# Patient Record
Sex: Male | Born: 1975 | Race: Black or African American | Hispanic: No | Marital: Married | Smoking: Current every day smoker
Health system: Southern US, Community
[De-identification: ages and names within clinical notes are randomized; demographics above are authoritative.]

## PROBLEM LIST (undated history)

## (undated) DIAGNOSIS — E119 Type 2 diabetes mellitus without complications: Secondary | ICD-10-CM

---

## 2003-05-09 ENCOUNTER — Other Ambulatory Visit: Payer: Self-pay

## 2005-01-25 ENCOUNTER — Inpatient Hospital Stay: Payer: Self-pay | Admitting: Internal Medicine

## 2005-01-25 IMAGING — CR DG ABDOMEN 3V
1 series · 5 of 5 positions shown · non-contrast
Comparison: none

REASON FOR EXAM: Abdominal pain
COMMENTS:

[Series 1: view not recorded · 0.17mm/px · 5 of 5 slices shown]
[im 1/5]
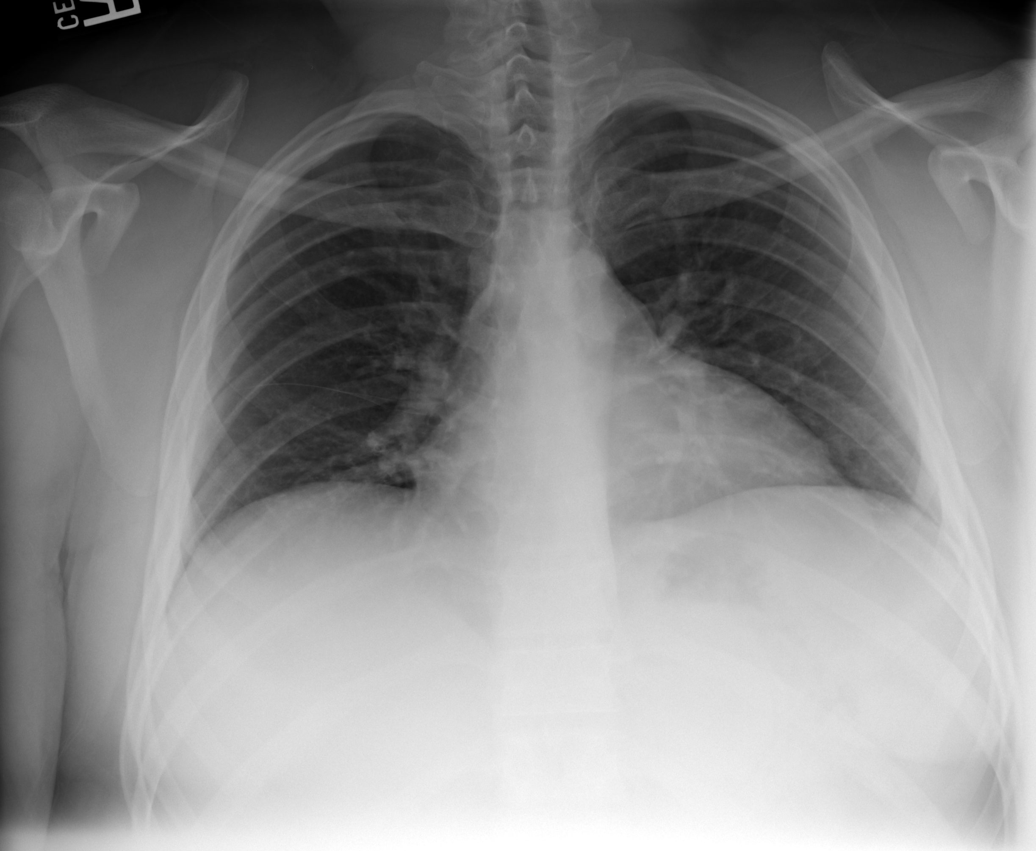
[im 2/5]
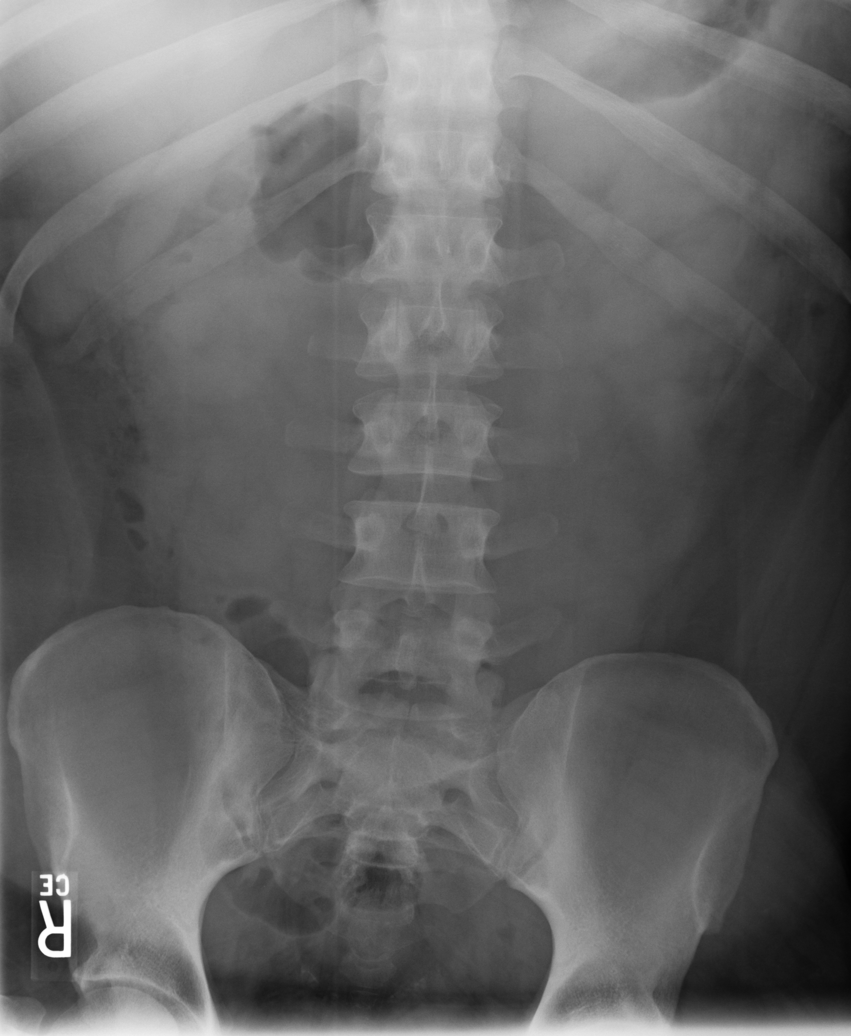
[im 3/5]
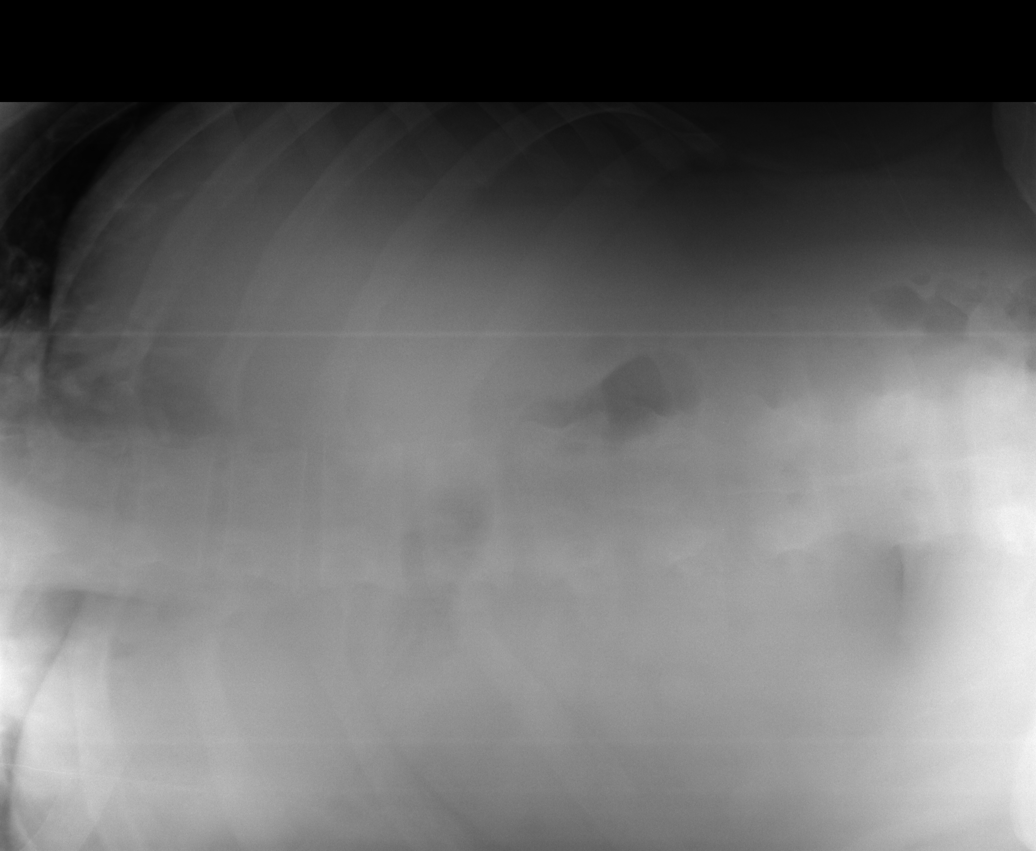
[im 4/5]
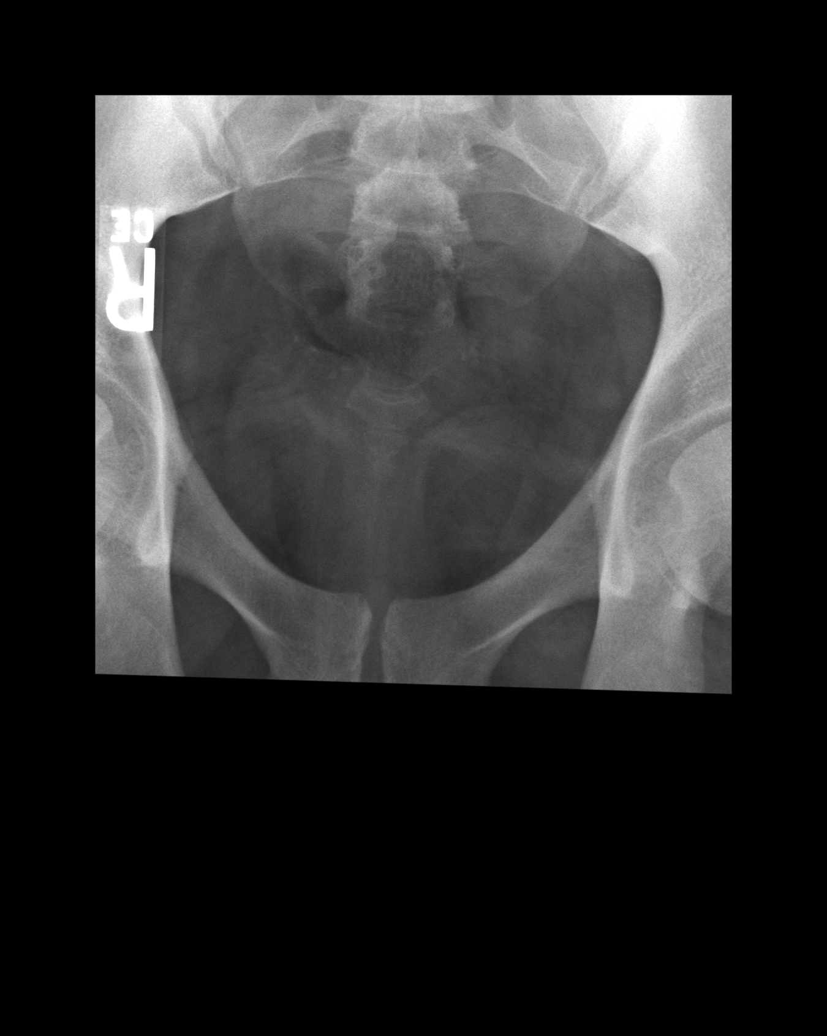
[im 5/5]
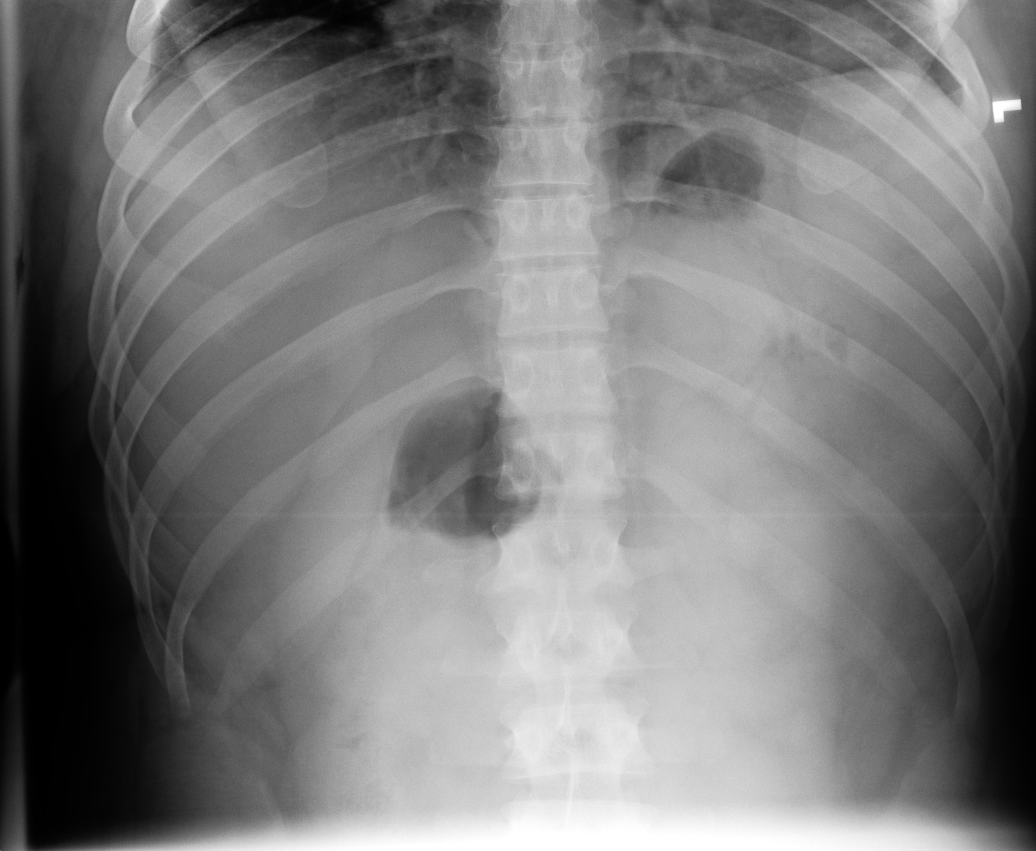

[5 of 5 positions shown; findings below may reference images not displayed]

PROCEDURE:     DXR - DXR ABDOMEN 3-WAY (INCL PA CXR)  - [DATE]  [DATE]

RESULT:     The lungs are hypoinfilated and clear.  The heart is top normal
in size.  The pulmonary vascularity is not engorged.  Three views of the
abdomen reveal a relatively nonspecific bowel gas pattern.  There is no
evidence of obstruction, perforation, or ileus.  I see no abnormal soft
tissue calcifications.
IMPRESSION: I see no acute intraabdominal abnormality on this study.  Specifically there
is no evidence of obstruction or ileus.

The lungs are hypoinflated but I see no evidence of acute cardiopulmonary
disease.

## 2005-06-11 ENCOUNTER — Emergency Department: Payer: Self-pay | Admitting: Emergency Medicine

## 2006-06-18 ENCOUNTER — Emergency Department: Payer: Self-pay | Admitting: Emergency Medicine

## 2007-05-12 ENCOUNTER — Inpatient Hospital Stay: Payer: Self-pay | Admitting: Internal Medicine

## 2008-04-06 ENCOUNTER — Inpatient Hospital Stay: Payer: Self-pay | Admitting: Internal Medicine

## 2008-04-06 IMAGING — CT CT ABD-PELV W/O CM
1 of 2 series · 15 of 32 positions shown, 19 images · non-contrast
Comparison: No comparison

REASON FOR EXAM: (1) epigastric abdominal pain with nausea, vomiting
elevated lipase unable to to
COMMENTS:   LMP: (Male)

PROCEDURE:     CT  - CT ABDOMEN AND PELVIS W[DATE]  [DATE]
RESULT:     Indication: Epigastric pain
TECHNIQUE: Multiple axial images from the lung bases to the symphysis pubis
were obtained without oral or intravenous contrast.

[Series 4: abdomen · axial · 0.88mm/px · z∈[-518,+4]mm · 15 of 197 slices shown, 19 images]
[im 15/197  soft-tissue]
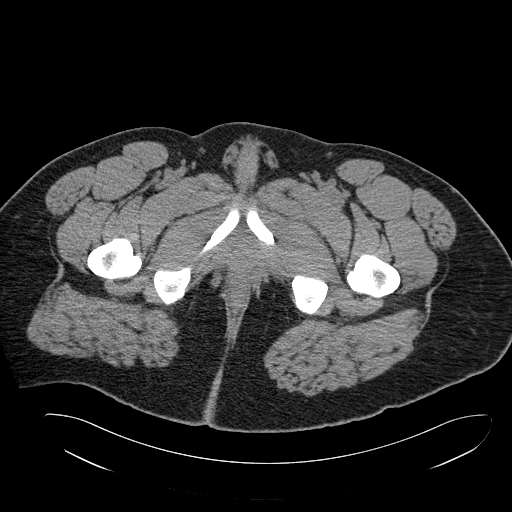
[im 15/197  bone]
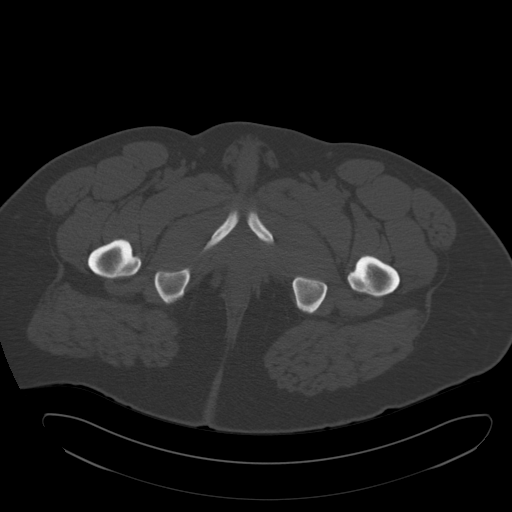
[im 30/197  soft-tissue]
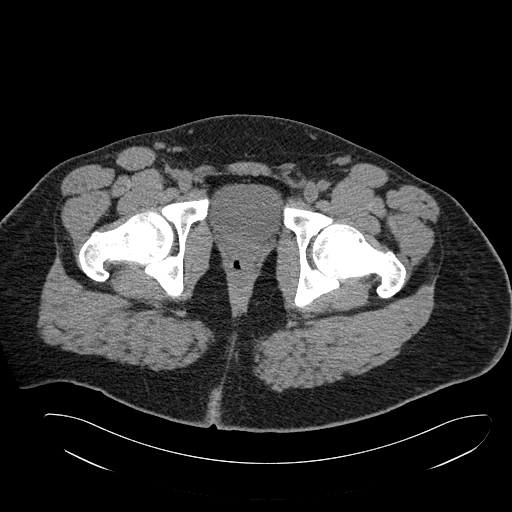
[im 44/197  soft-tissue]
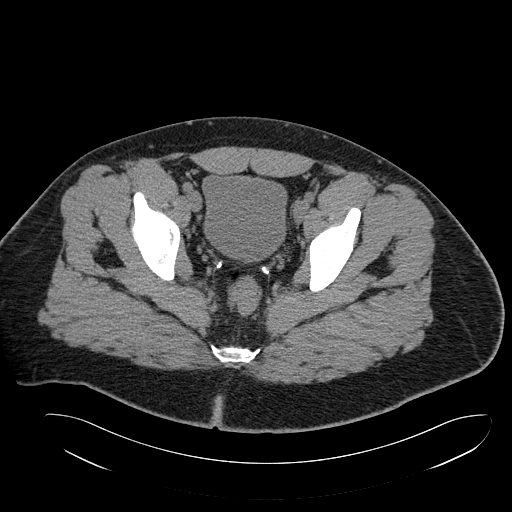
[im 59/197  soft-tissue]
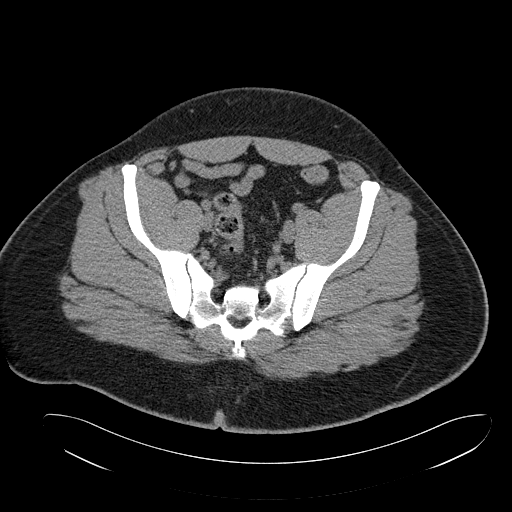
[im 73/197  soft-tissue]
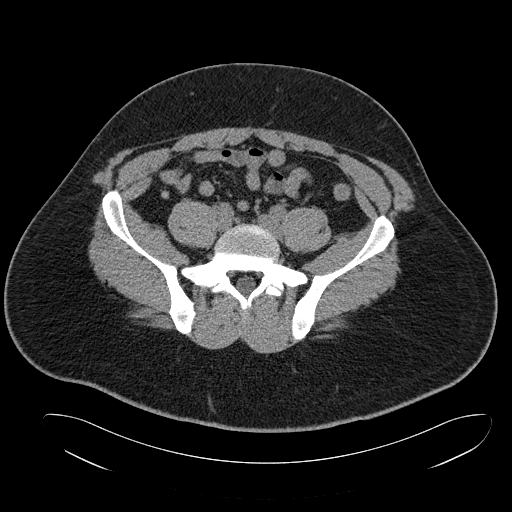
[im 88/197  soft-tissue]
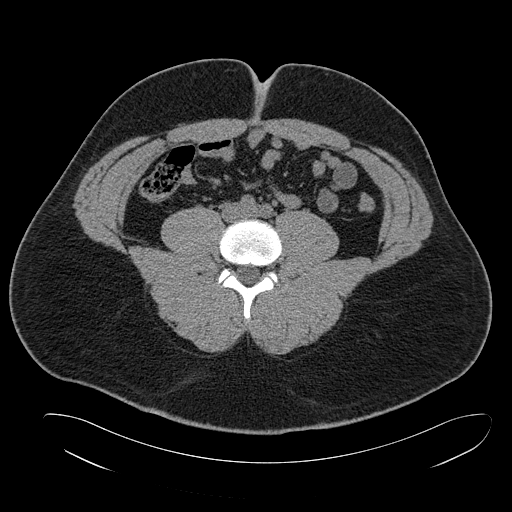
[im 102/197  soft-tissue]
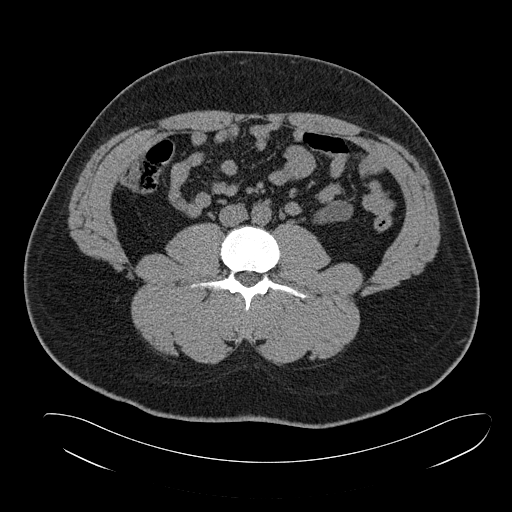
[im 117/197  soft-tissue]
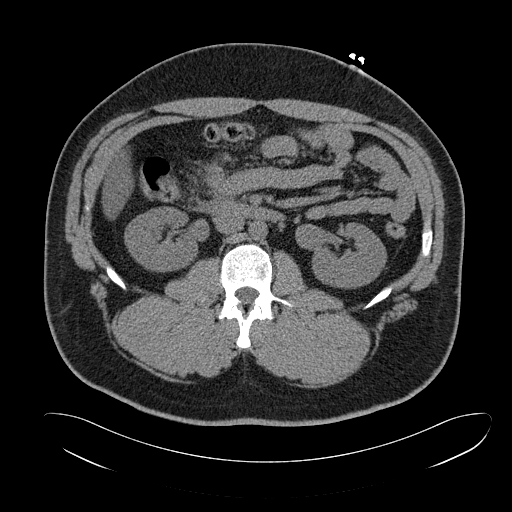
[im 131/197  soft-tissue]
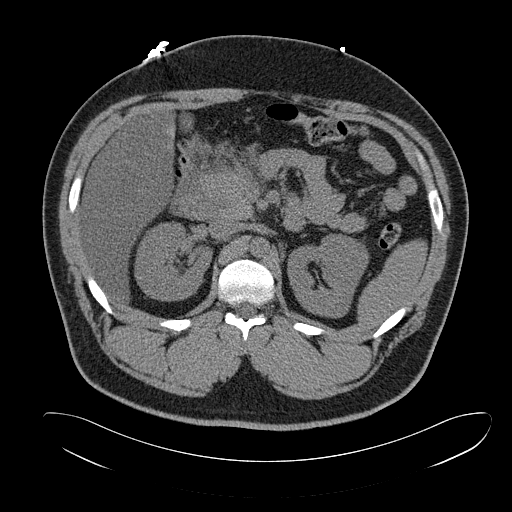
[im 131/197  bone]
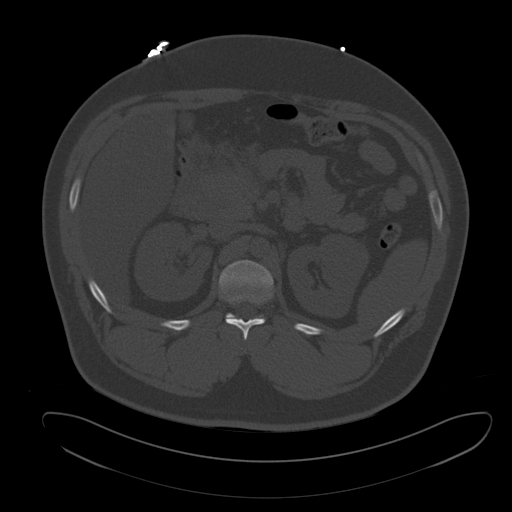
[im 146/197  soft-tissue]
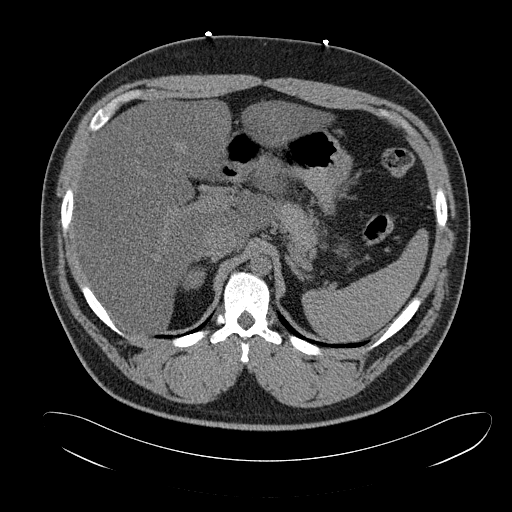
[im 160/197  soft-tissue]
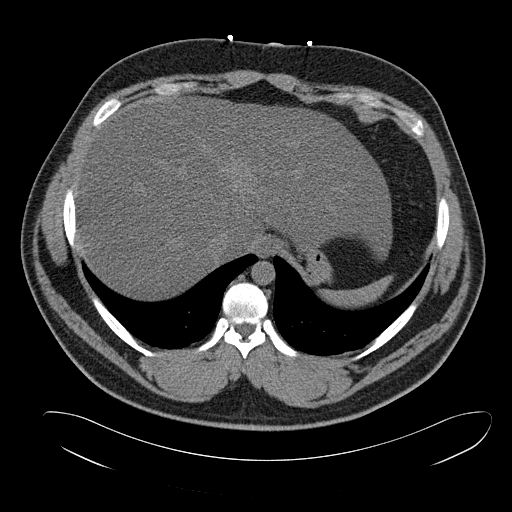
[im 167/197  lung]
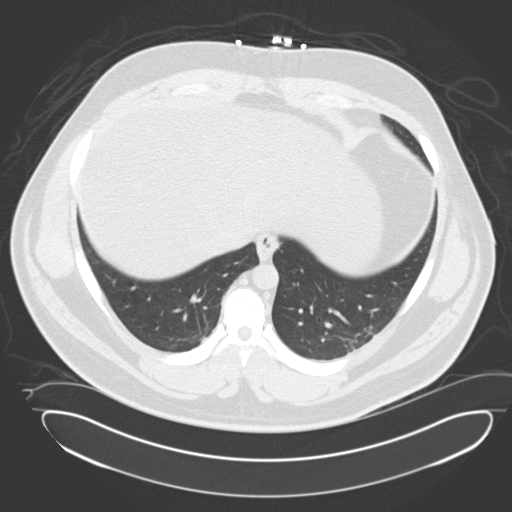
[im 175/197  soft-tissue]
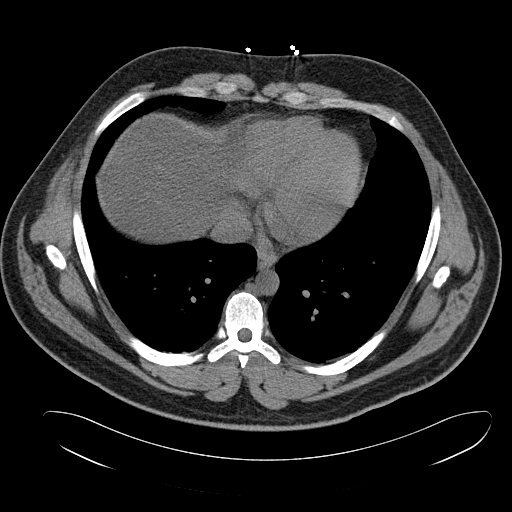
[im 175/197  lung]
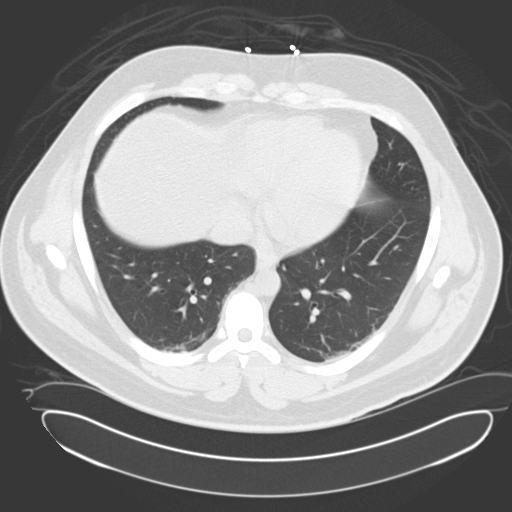
[im 182/197  lung]
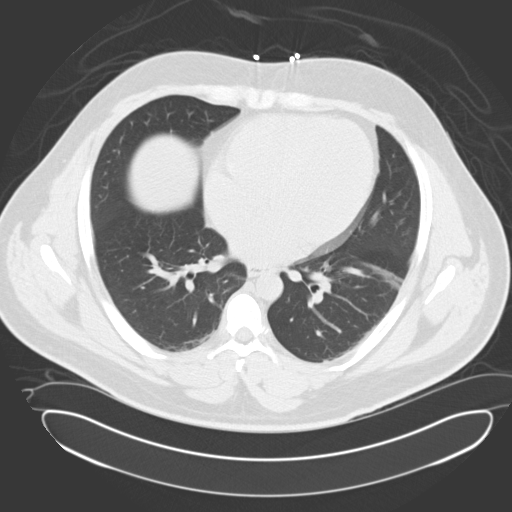
[im 189/197  soft-tissue]
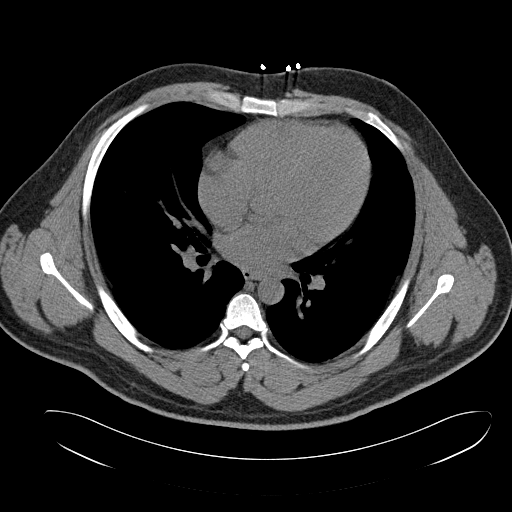
[im 189/197  lung]
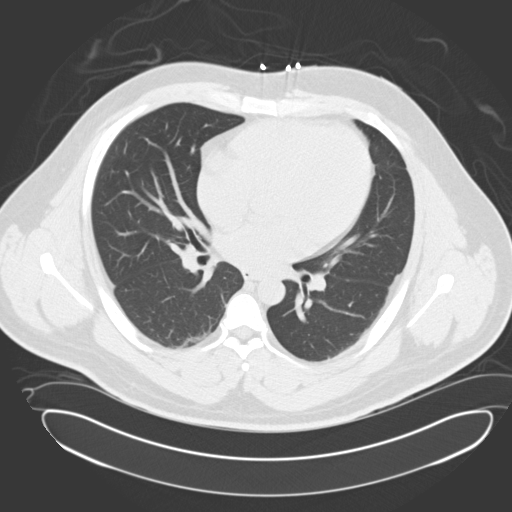

[15 of 32 positions shown; findings below may reference images not displayed]

FINDINGS: The lung bases are clear. There is no pleural or pericardial effusions.

No renal, ureteral, or bladder calculi. No obstructive uropathy.
There are no secondary signs of obstruction. No perinephric stranding is
seen. The kidneys are symmetric in size without evidence for exophytic mass.
The bladder is unremarkable.

The liver is diffusely low in attenuation consistent with hepatic steatosis.
The gallbladder is unremarkable. The spleen demonstrates no focal
abnormality. The adrenal glands are normal. There are peripancreatic
inflammatory changes surrounding the pancreatic head and uncinate process
most consistent with pancreatitis. There is no focal fluid collection to
suggest an abscess.

The unopacified stomach, duodenum, small intestine, and large intestine are
unremarkable, but evaluation is limited by lack of oral contrast. There is
diverticulosis without evidence of diverticulitis. There is a normal caliber
appendix without periappendiceal inflammatory changes. There is no
pneumoperitoneum, pneumatosis, or portal venous gas. There is no abdominal
or pelvic free fluid. There is no lymphadenopathy.

The abdominal aorta is normal in caliber.

The osseous structures are unremarkable.
IMPRESSION: 1. Peripancreatic inflammatory changes around the pancreatic head and
uncinate process most consistent with acute pancreatitis. There is no
evidence of a pseudocyst or drainable fluid collection.

## 2008-04-07 IMAGING — US ABDOMEN ULTRASOUND
1 series · 17 of 25 positions shown · non-contrast
Comparison: none

REASON FOR EXAM: abd. pain
COMMENTS:

[Series 1: abdomen ultrasound · 17 of 50 slices shown]
[im 1/50]
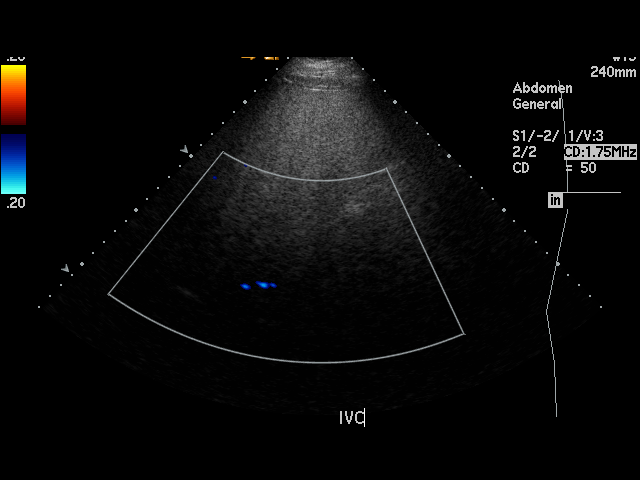
[im 5/50]
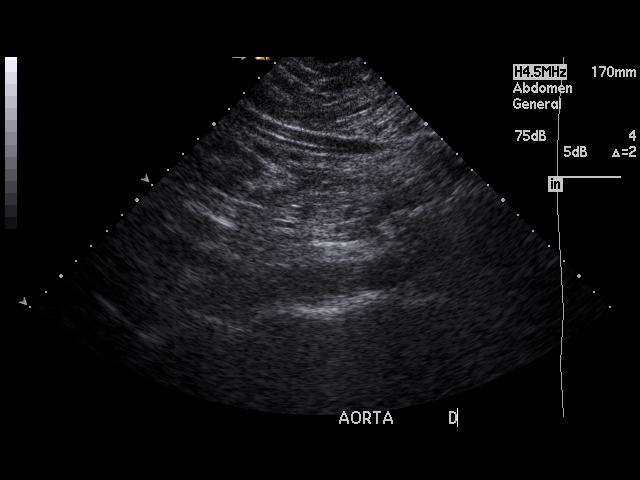
[im 7/50]
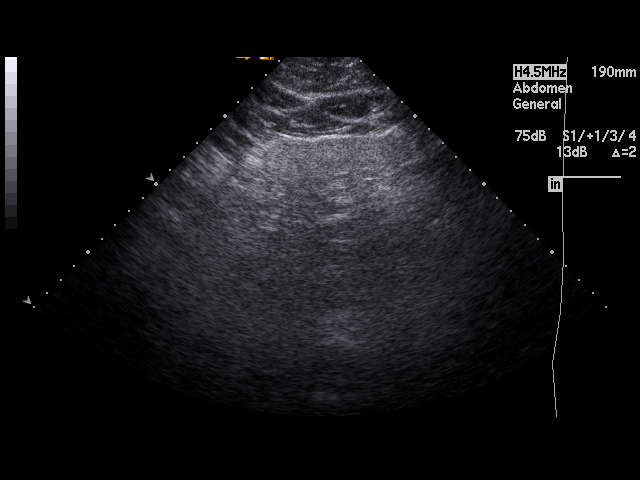
[im 11/50]
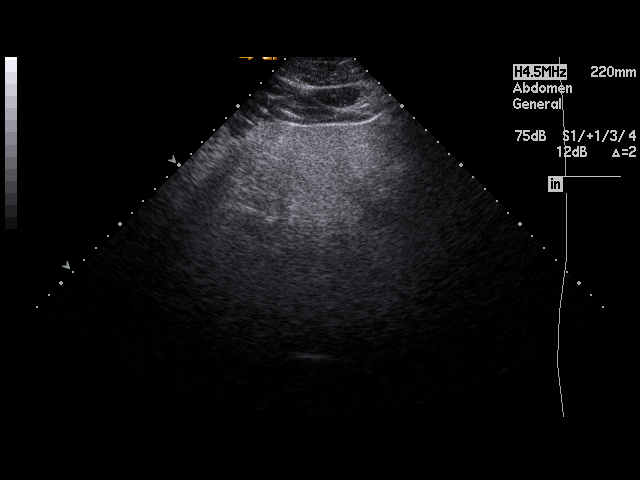
[im 13/50]
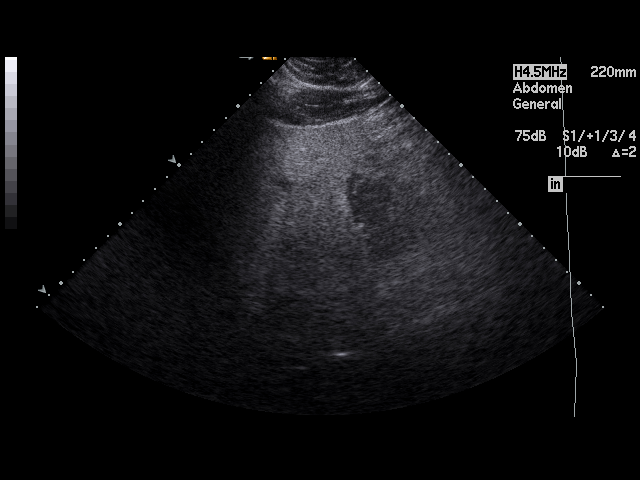
[im 17/50]
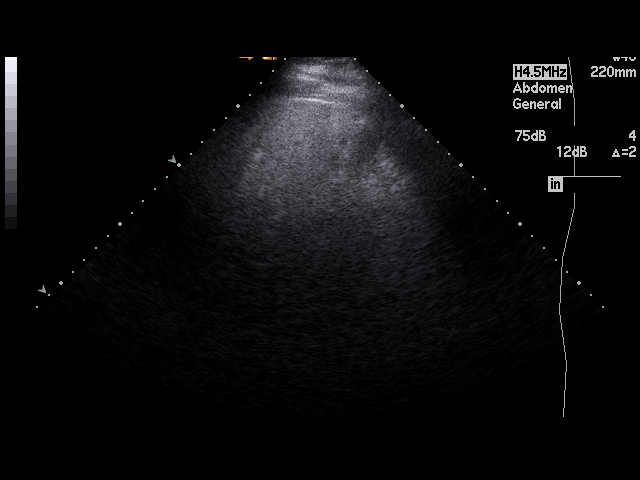
[im 19/50]
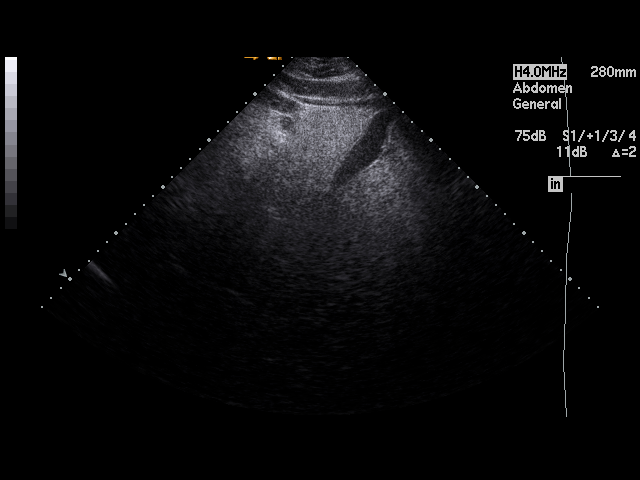
[im 23/50]
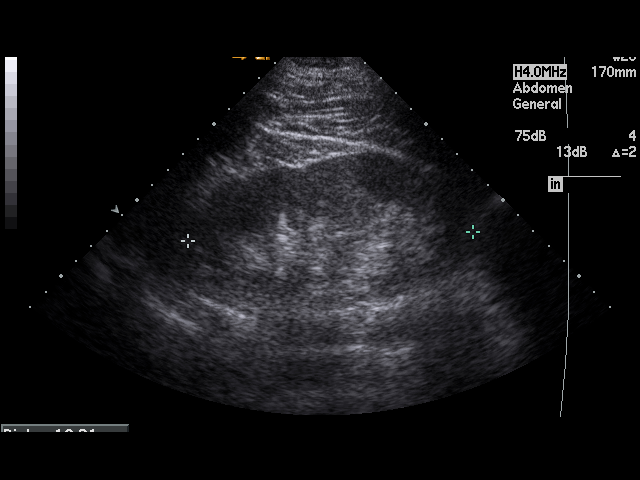
[im 25/50]
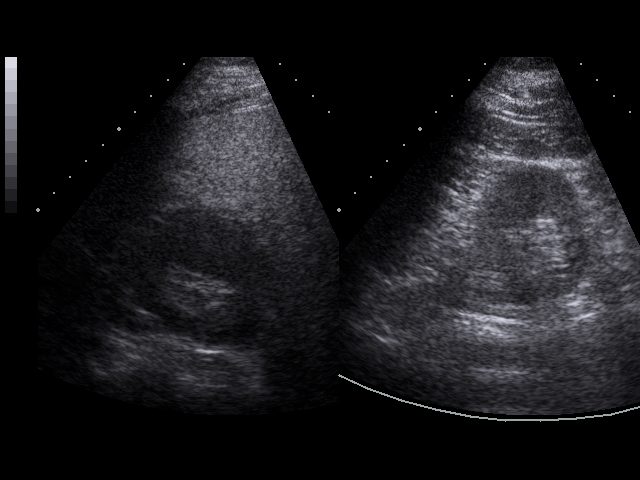
[im 27/50]
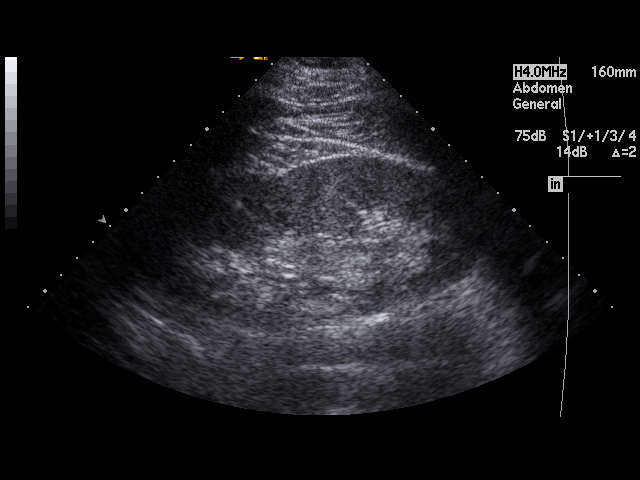
[im 31/50]
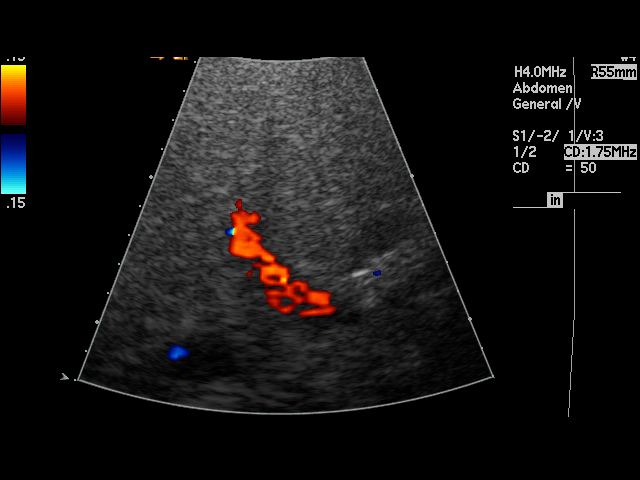
[im 33/50]
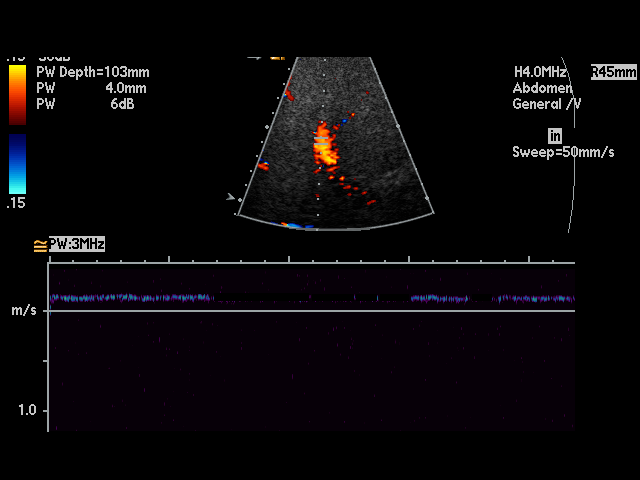
[im 37/50]
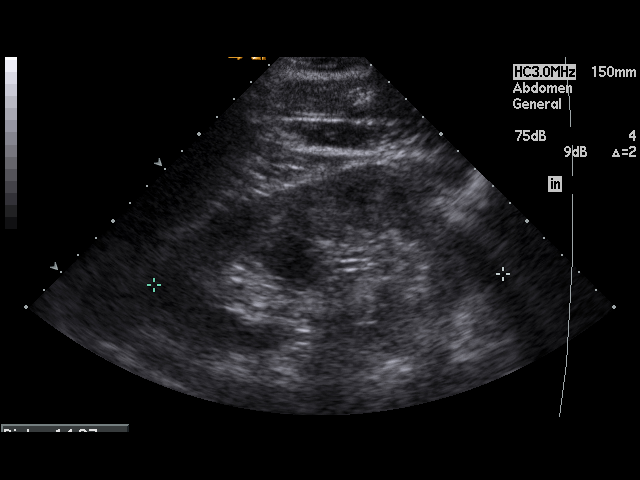
[im 39/50]
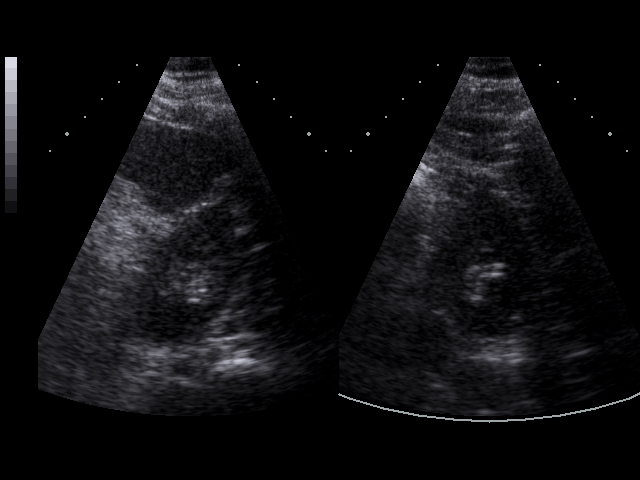
[im 43/50]
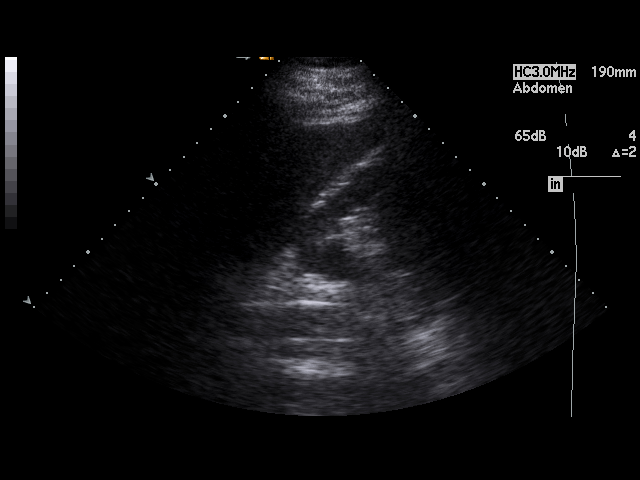
[im 45/50]
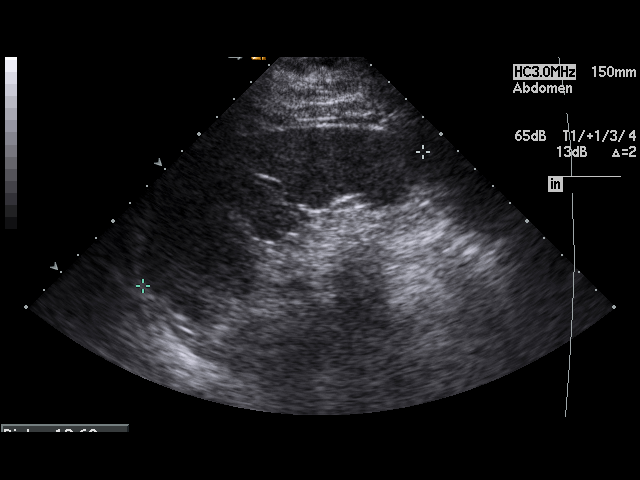
[im 50/50]
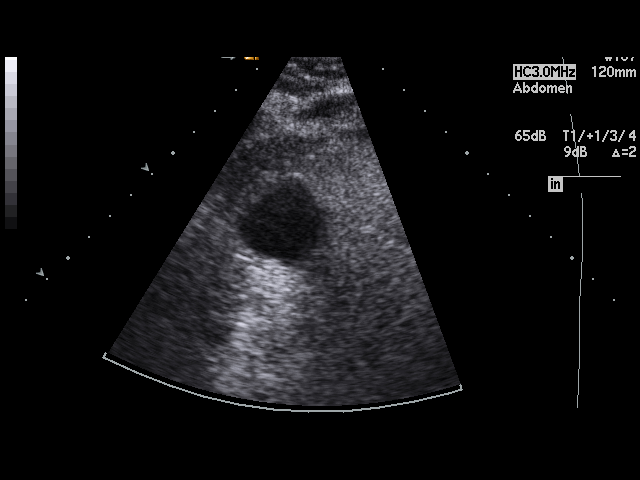

[17 of 25 positions shown; findings below may reference images not displayed]

PROCEDURE:     US  - US ABDOMEN GENERAL SURVEY  - [DATE]  [DATE]

RESULT:     This study is suboptimal secondary to the patient's body
habitus. The patient is morbidly obese.

There is diffuse increased echogenicity throughout the liver. Hepatopetal
flow is demonstrated within the portal vein. The visualized portion of the
aorta and IVC are unremarkable. The pancreas is not visualized.  The spleen
demonstrates a homogeneous echotexture and measures 2.64 cm in longitudinal
dimensions. Evaluation of the gallbladder fossa demonstrates no evidence of
pericholecystic fluid, gallstones or sludging. There is no evidence of a
sonographic Murphy sign. Gallbladder wall thickness 1.6 mm. The common bile
duct measures 4.5 mm in diameter. Evaluation of the RIGHT and LEFT kidneys
demonstrates no evidence of hydronephrosis, masses or calculi.
IMPRESSION: Suboptimal evaluation with findings consistent with hepatic steatosis. The
pancreas is not visualized.

## 2008-07-21 ENCOUNTER — Inpatient Hospital Stay: Payer: Self-pay | Admitting: Internal Medicine

## 2008-07-22 IMAGING — US ABDOMEN ULTRASOUND
1 series · 17 of 25 positions shown · non-contrast
Comparison: none

REASON FOR EXAM: coagulopathy/transaminitis
COMMENTS:

[Series 1: abdomen ultrasound · 17 of 46 slices shown]
[im 1/46]
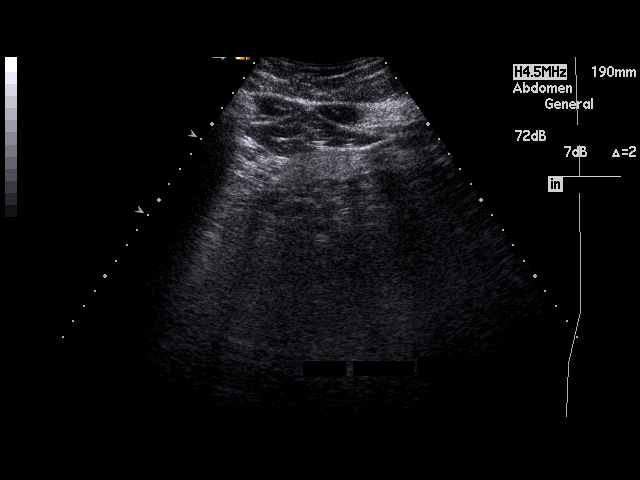
[im 4/46]
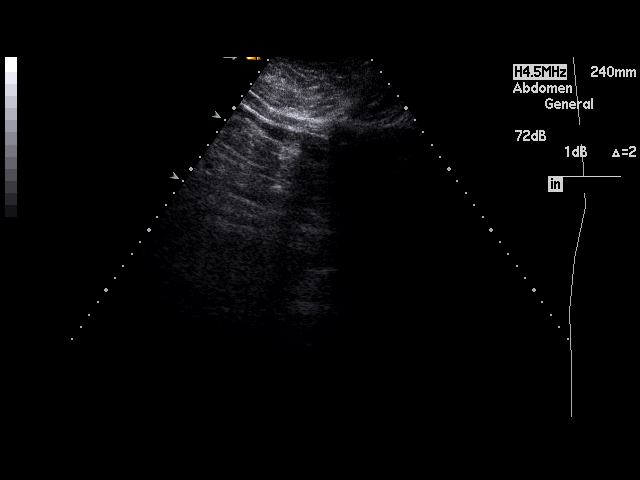
[im 6/46]
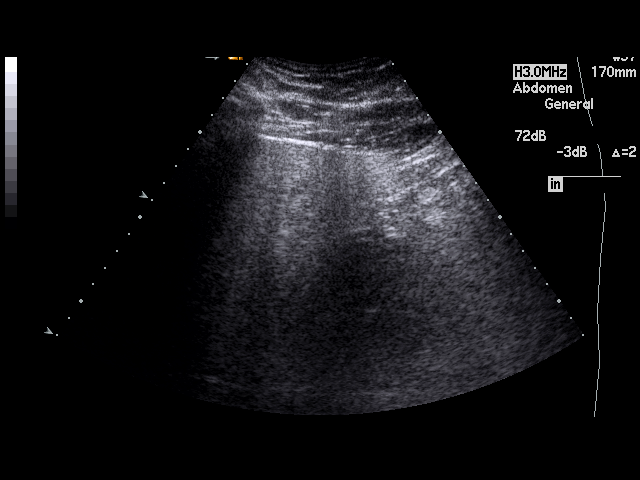
[im 10/46]
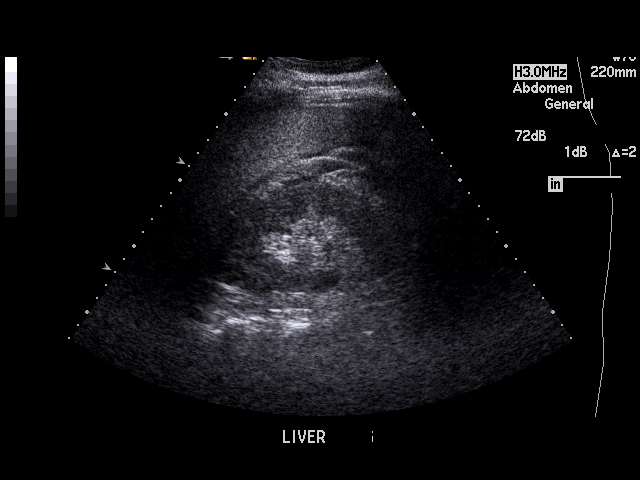
[im 12/46]
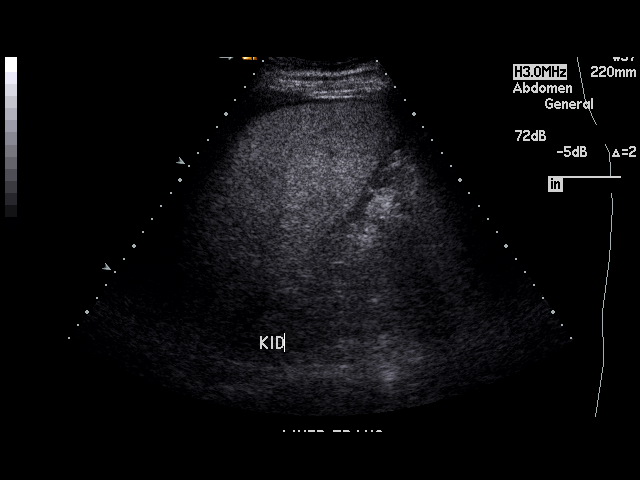
[im 16/46]
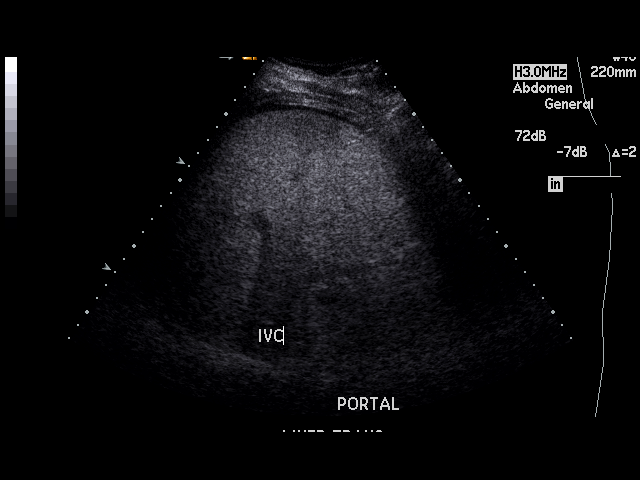
[im 17/46]
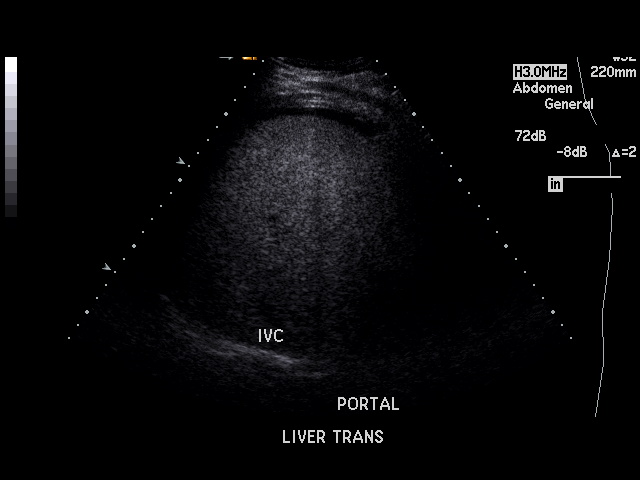
[im 21/46]
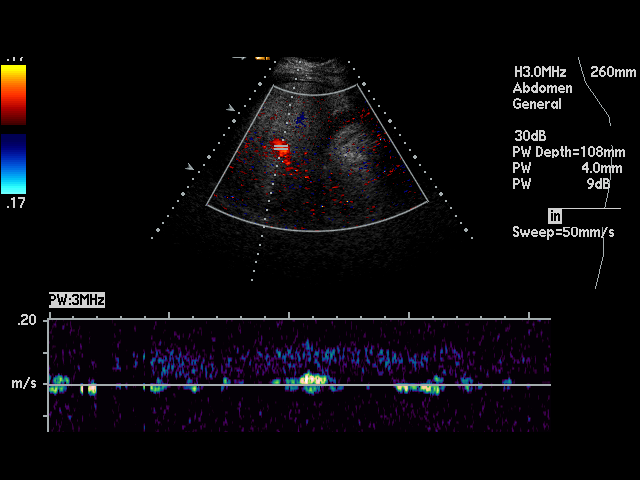
[im 23/46]
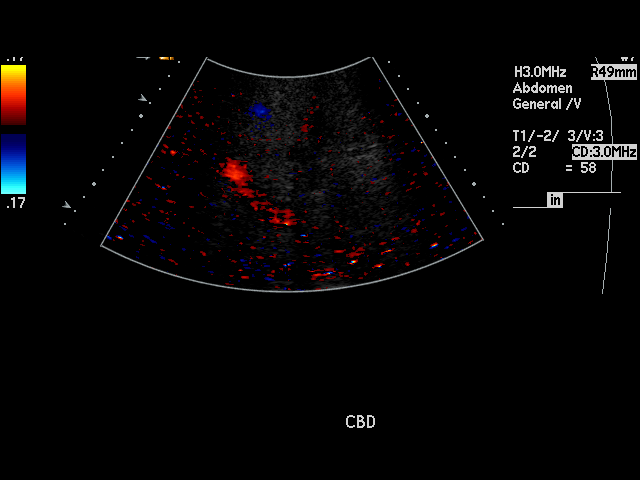
[im 25/46]
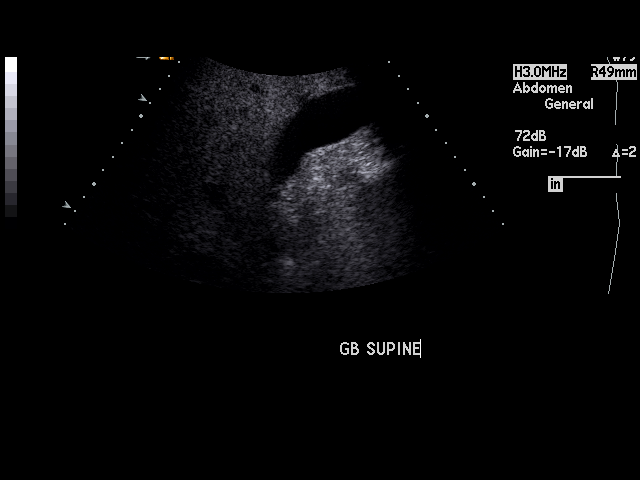
[im 29/46]
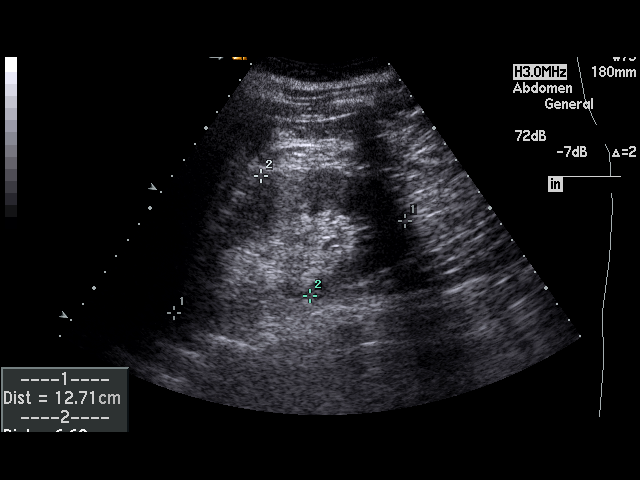
[im 31/46]
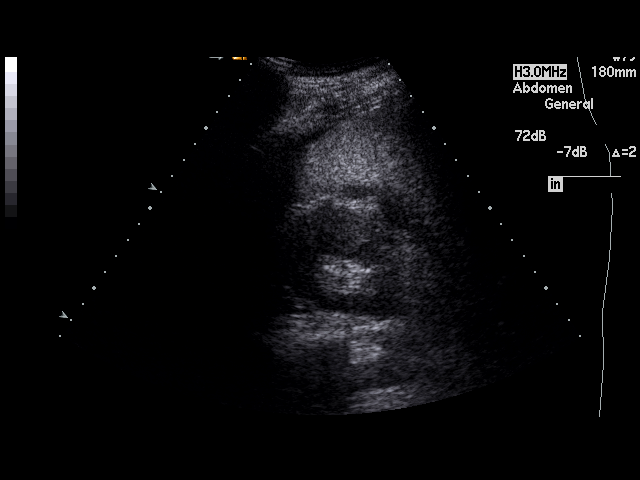
[im 34/46]
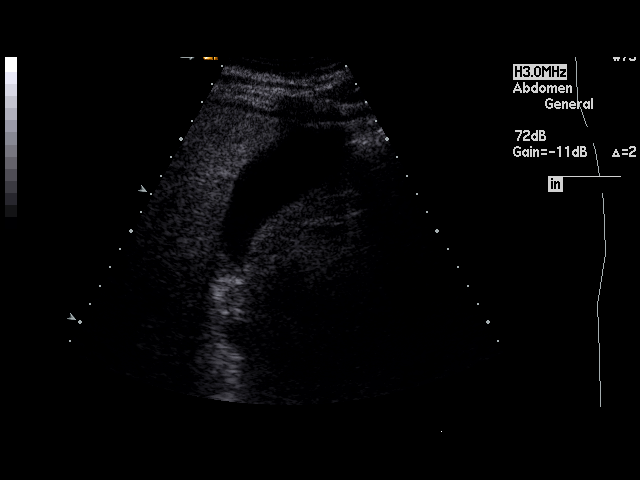
[im 36/46]
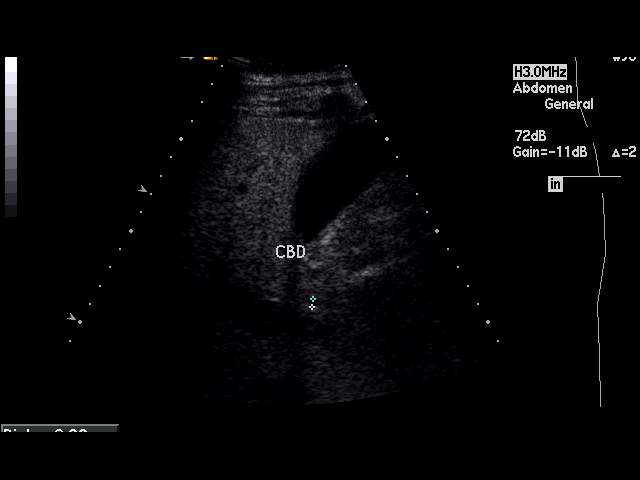
[im 40/46]
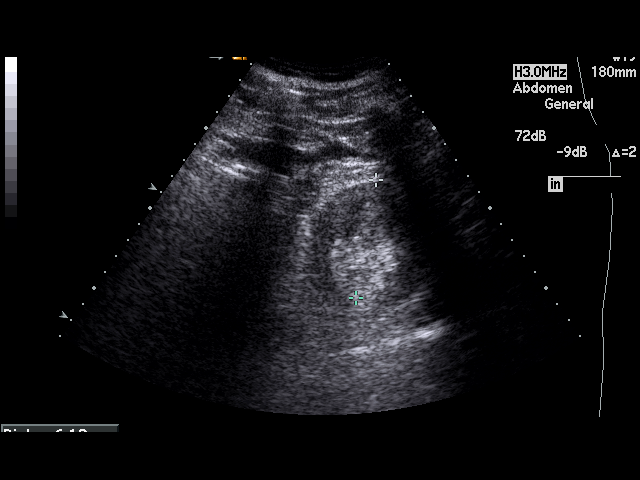
[im 42/46]
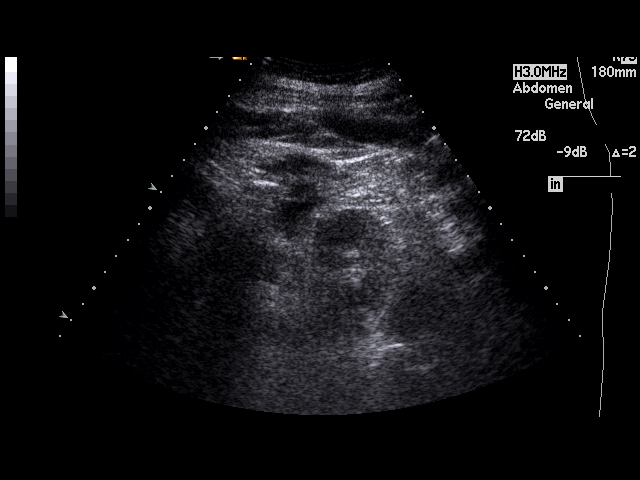
[im 46/46]
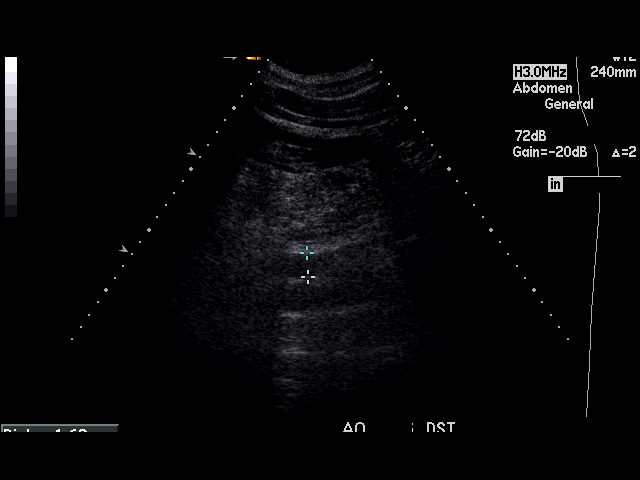

[17 of 25 positions shown; findings below may reference images not displayed]

PROCEDURE:     US  - US ABDOMEN GENERAL SURVEY  - [DATE]  [DATE]

RESULT:     The liver are was difficult to penetrate with the ultrasound
beam which likely reflects an element of fatty infiltration. Portal venous
flow is normal in direction toward the liver. The gallbladder is adequately
distended with no evidence of stones, wall thickening, or pericholecystic
fluid. There is a small amount of ascites adjacent to the liver. The common
bile duct measured 4.3 mm in diameter. The spleen is normal in echotexture
and size at 12.4 cm. Limited evaluation of the abdominal aorta exhibited no
acute abnormality. The right kidney measured 12.7 cm in length and the left
kidney 13.9 cm in length. There is no evidence of obstruction or parenchymal
masses of the kidneys.
IMPRESSION: 1. There is a small amount of ascites present. The liver exhibits findings
that suggest fatty infiltration.
2. There are no gallstones identified.
3. The pancreas could not be adequately demonstrated due to the presence of
bowel gas. The abdominal aortic evaluation was limited as well due to bowel
gas.

## 2008-07-24 IMAGING — CT CT ABDOMEN W/ CM
1 of 2 series · 15 of 32 positions shown, 19 images · non-contrast
Comparison: none

REASON FOR EXAM: (1) abd pain; (2) abd pain
COMMENTS:

[Series 4: pancreas · axial · 0.84mm/px · z∈[-69,+303]mm · 15 of 136 slices shown, 19 images]
[im 6/136  soft-tissue]
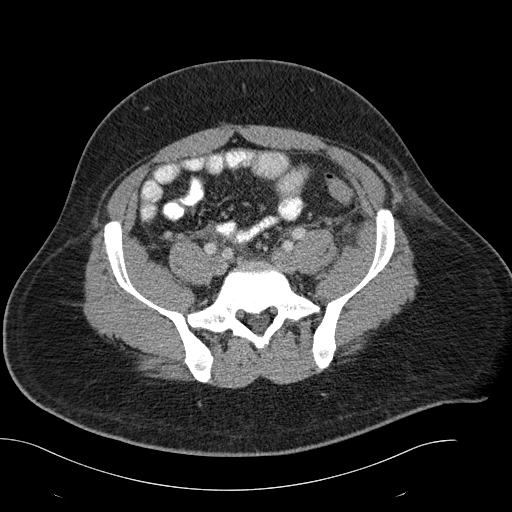
[im 6/136  bone]
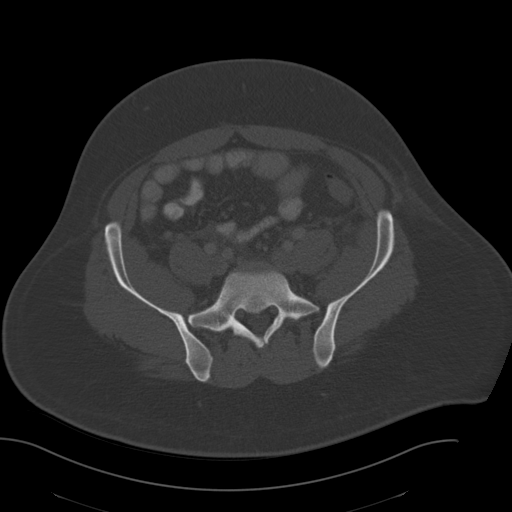
[im 16/136  soft-tissue]
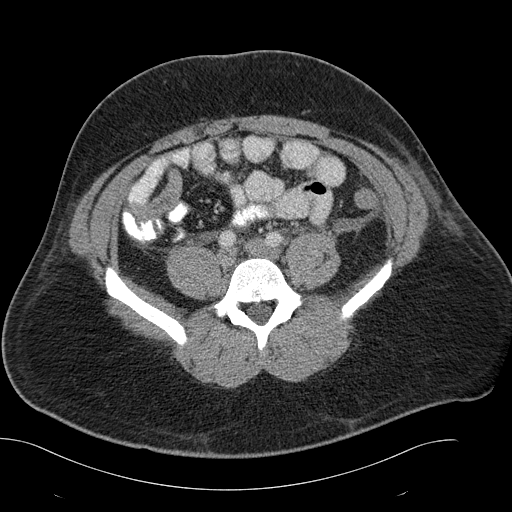
[im 26/136  soft-tissue]
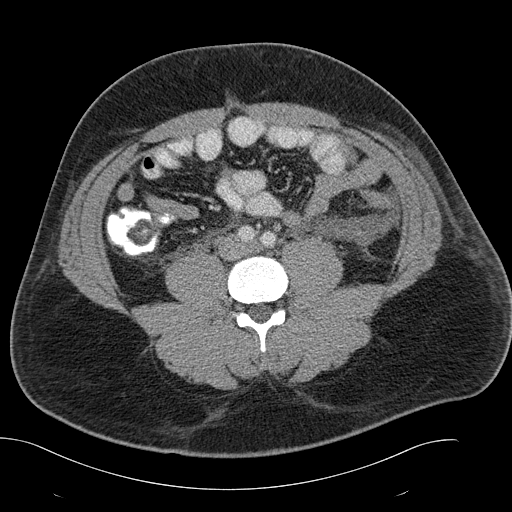
[im 37/136  soft-tissue]
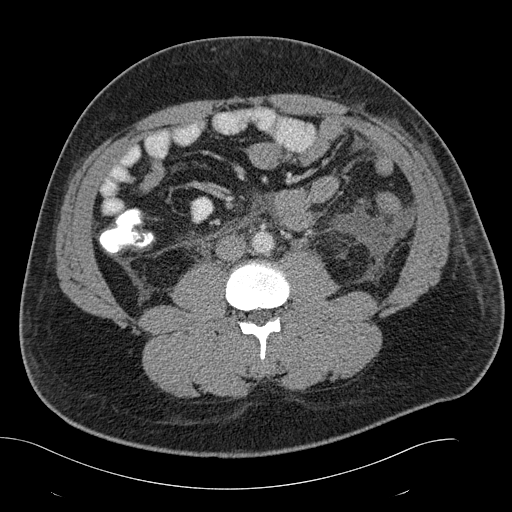
[im 47/136  soft-tissue]
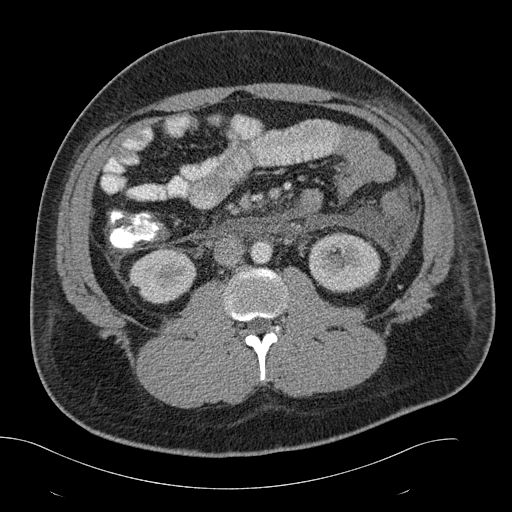
[im 58/136  soft-tissue]
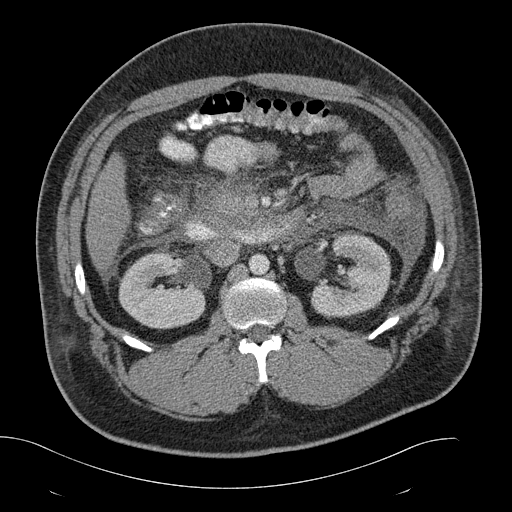
[im 68/136  soft-tissue]
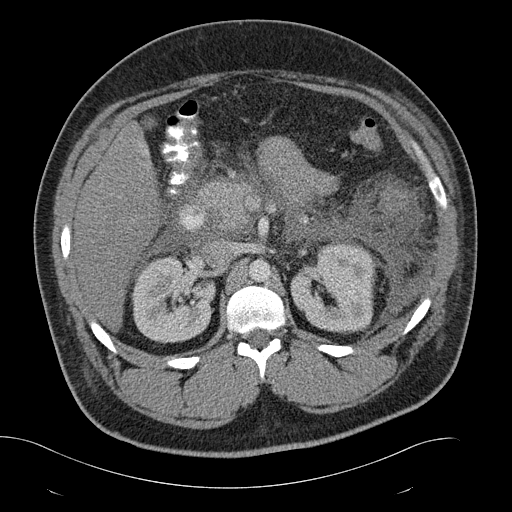
[im 78/136  soft-tissue]
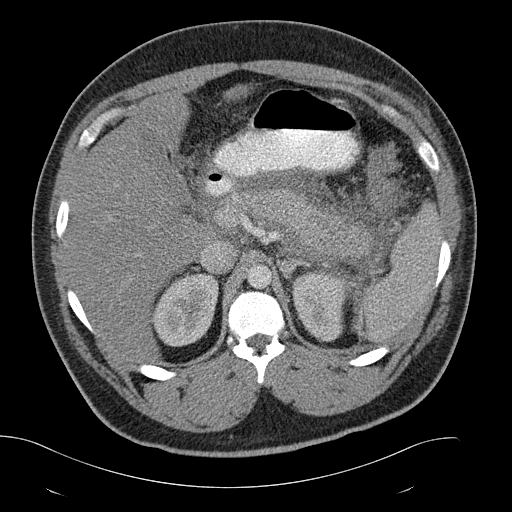
[im 89/136  soft-tissue]
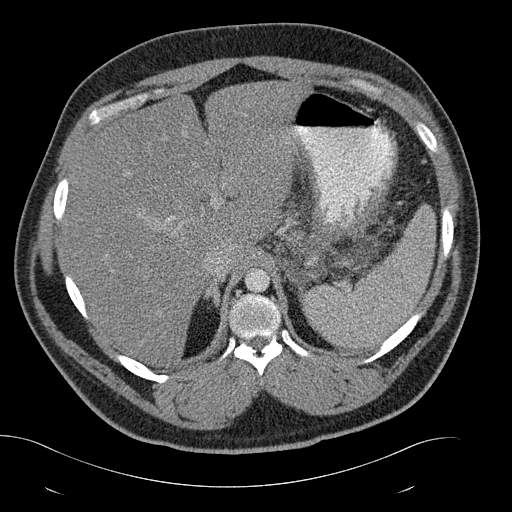
[im 89/136  bone]
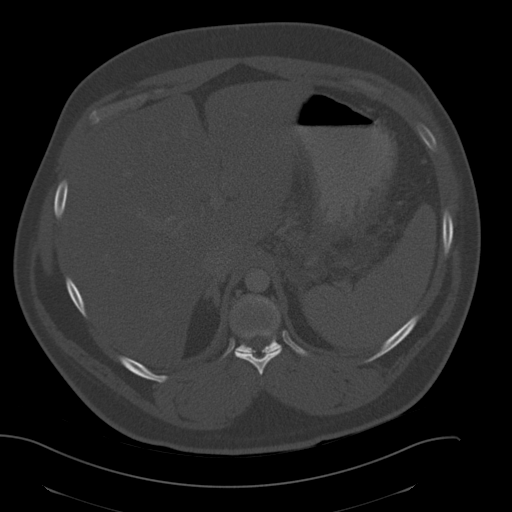
[im 99/136  soft-tissue]
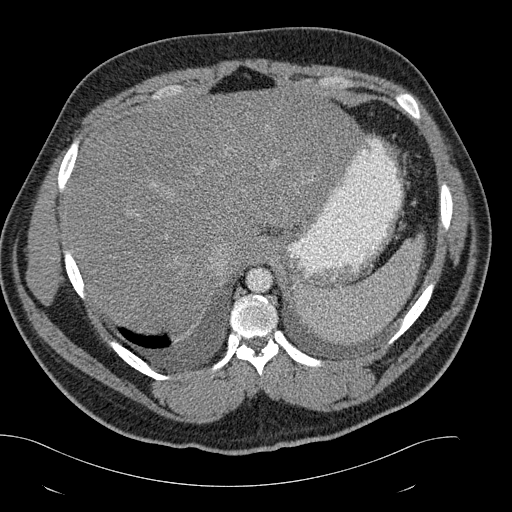
[im 110/136  soft-tissue]
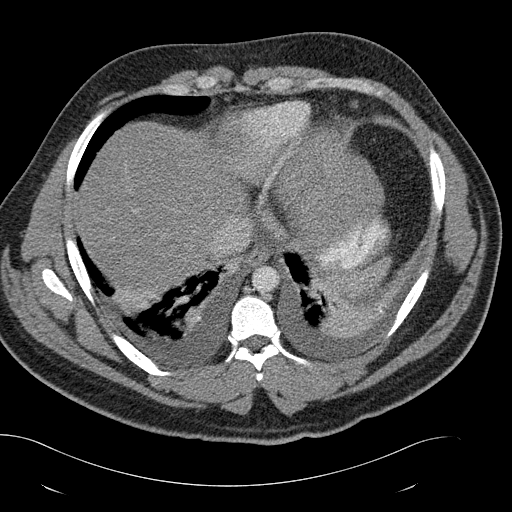
[im 115/136  lung]
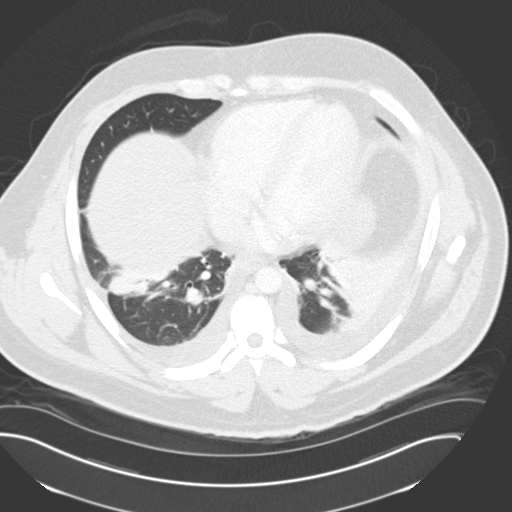
[im 120/136  soft-tissue]
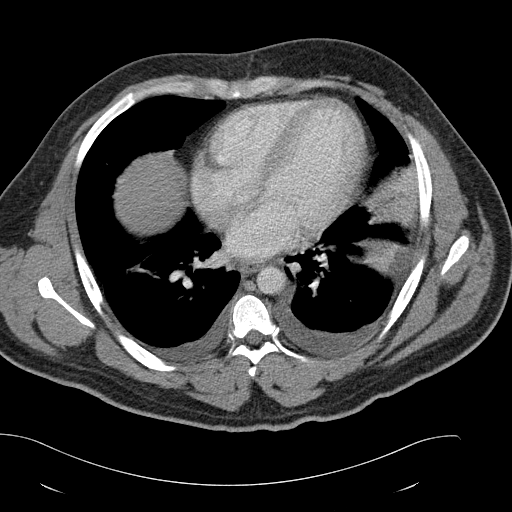
[im 120/136  lung]
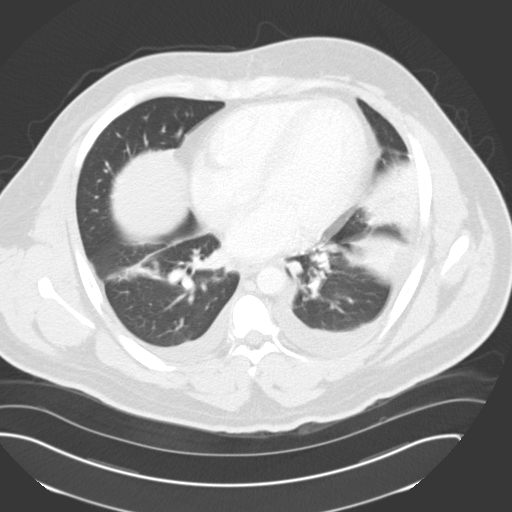
[im 125/136  lung]
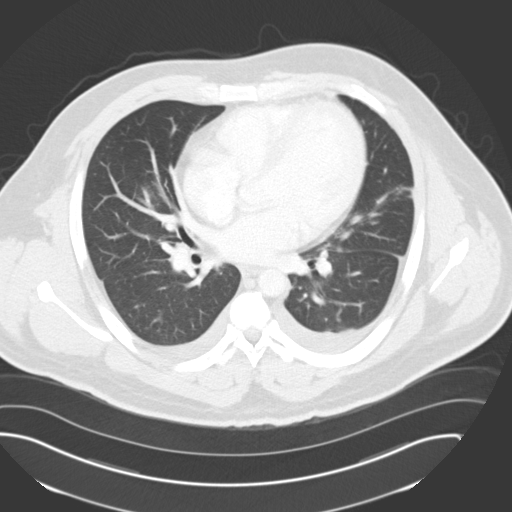
[im 130/136  soft-tissue]
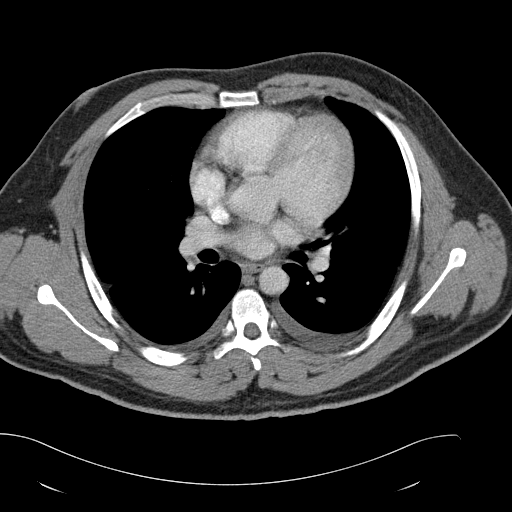
[im 130/136  lung]
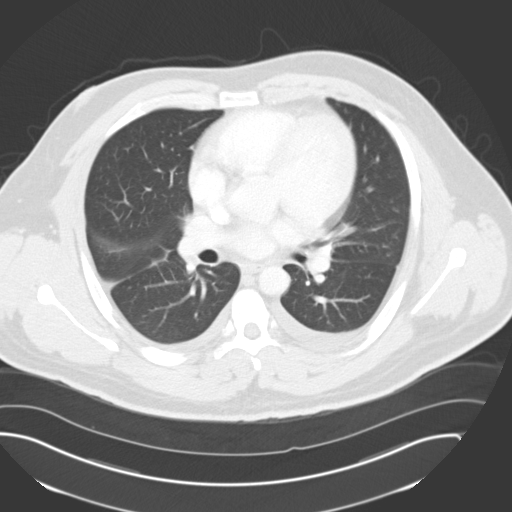

[15 of 32 positions shown; findings below may reference images not displayed]

PROCEDURE:     CT  - CT ABDOMEN COMPLEX W  - [DATE]  [DATE]

RESULT:     Axial CT scanning was performed through the abdomen at 3 mm
intervals and slice thicknesses. Comparison is made to a study [DATE]. Review of 3-dimensional reconstructed images was performed separately
on the [employer] monitor.

There is an abnormal appearance of the pancreas and apparent. Pancreatic
soft tissues consistent with acute pancreatitis. Diffuse pancreatic edema is
present. Fluid surrounds the pancreas and extends to the splenic hilum.
Fluid lies posterior to the stomach and extends into the porta hepatis. The
liver exhibits no focal mass nor ductal dilation. The gallbladder is
adequately distended with no calcified stones. The partially distended
stomach is grossly normal though some wall thickening of its posterior
aspect may be present due to adjacent pancreatic inflammation. There are
bibasilar infiltrates in the lungs and there are small bilateral pleural
effusions layering posteriorly.

The adrenal glands and kidneys exhibit no acute abnormality. The caliber of
the abdominal aorta is normal. The partially contrast-filled loops of small
and large bowel exhibit no acute abnormality where visualized. Fluid extends
into the perirenal spaces bilaterally and into the left paracolic gutter.
The lumbar vertebral bodies are preserved in height.
IMPRESSION: 1. Since the prior study there has been dramatic progression in the
inflammatory changes surrounding the pancreas. The pancreas is diffusely
edematous. These findings are consistent with acute pancreatitis. A discrete
low density mass within the pancreas is not identified.
2. I see no biliary ductal dilation. The gallbladder is normal in appearance
and the liver and spleen exhibit no acute abnormality.
3. There are is a ascites and there are bilateral pleural effusions layering
posteriorly. There are bibasilar infiltrates consistent with developing
pneumonia.

## 2010-01-06 ENCOUNTER — Inpatient Hospital Stay: Payer: Self-pay | Admitting: Internal Medicine

## 2010-01-07 IMAGING — CT CT ABDOMEN W/ CM
1 of 2 series · 15 of 32 positions shown, 19 images · IV contrast (isovue)
Comparison: [DATE]

REASON FOR EXAM: pancreatitis, pancreatic protocol
COMMENTS:

PROCEDURE:     CT  - CT ABDOMEN COMPLEX W  - [DATE]  [DATE]
RESULT:     History: Status
TECHNIQUE: Multiple axial images of the abdomen were performed from the lung
bases to the iliac crests, with p.o. contrast and with 100 ml of Isovue 370
intravenous contrast.

[Series 2: pancreas · axial · 0.82mm/px · z∈[-444,-84]mm · 15 of 130 slices shown, 19 images]
[im 5/130  soft-tissue]
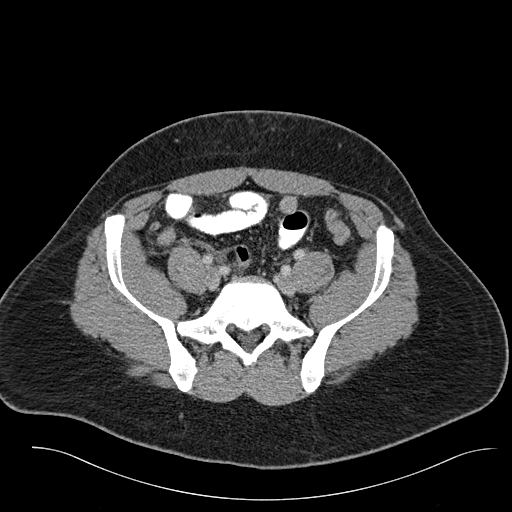
[im 5/130  bone]
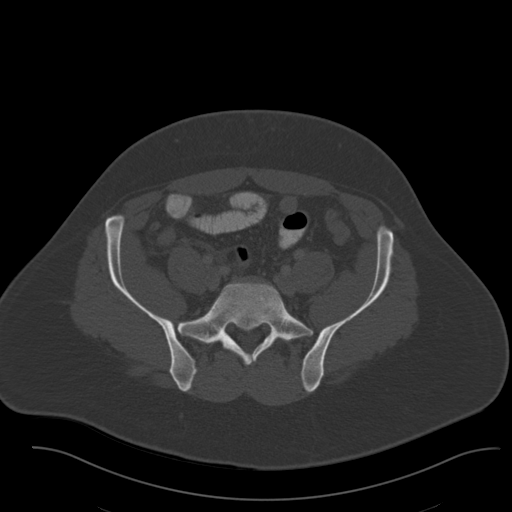
[im 15/130  soft-tissue]
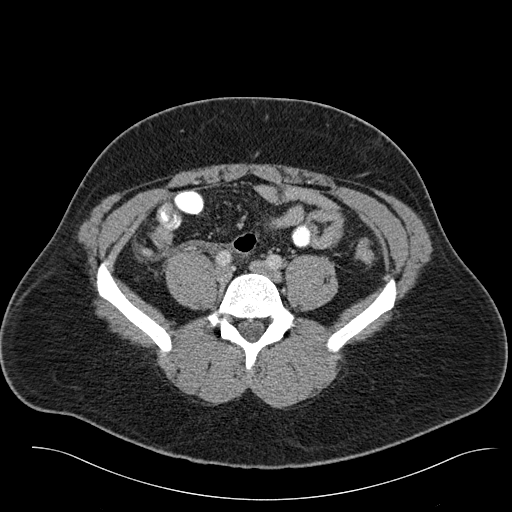
[im 25/130  soft-tissue]
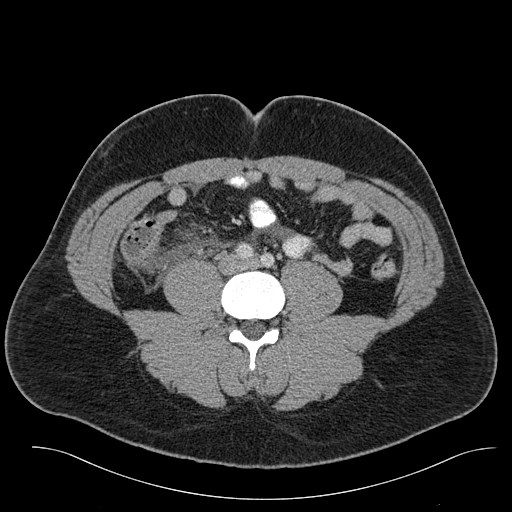
[im 35/130  soft-tissue]
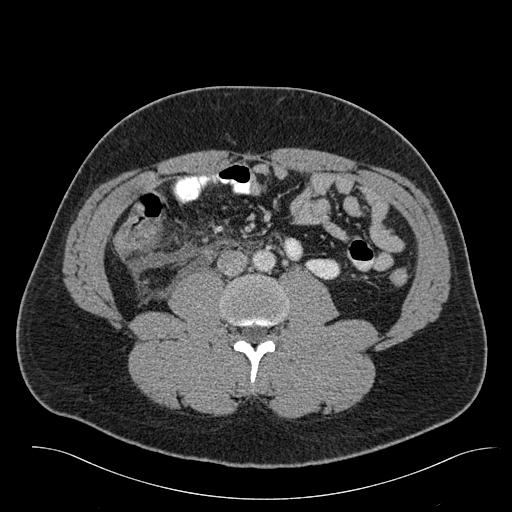
[im 45/130  soft-tissue]
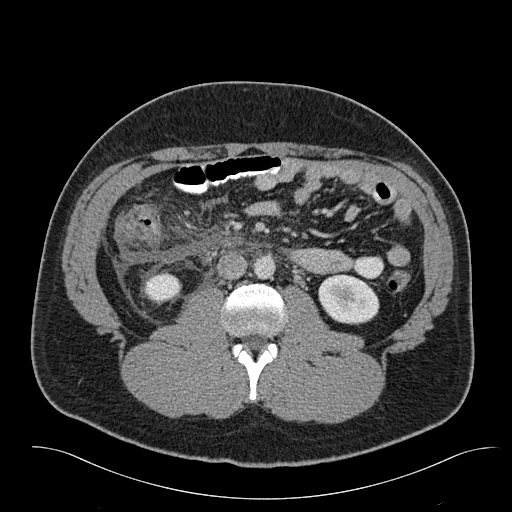
[im 55/130  soft-tissue]
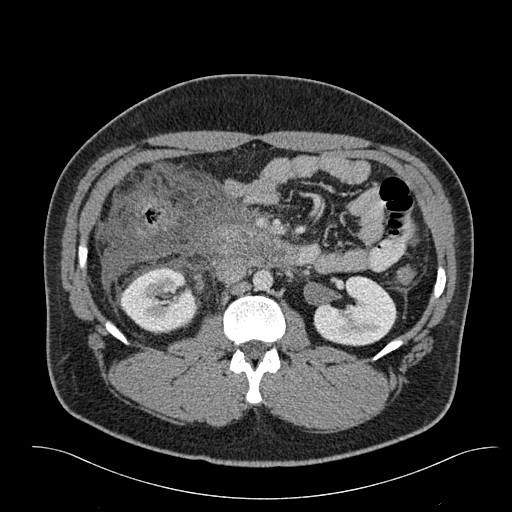
[im 65/130  soft-tissue]
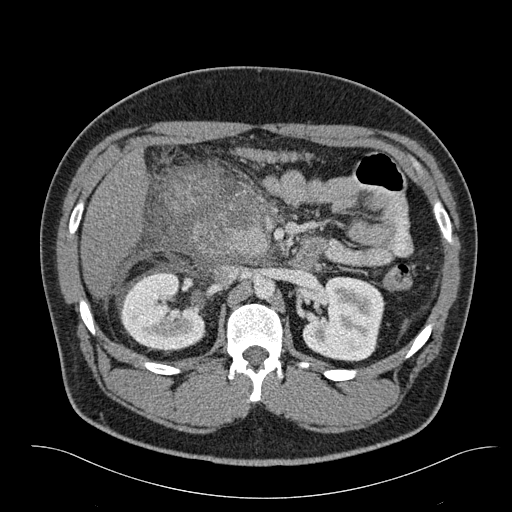
[im 75/130  soft-tissue]
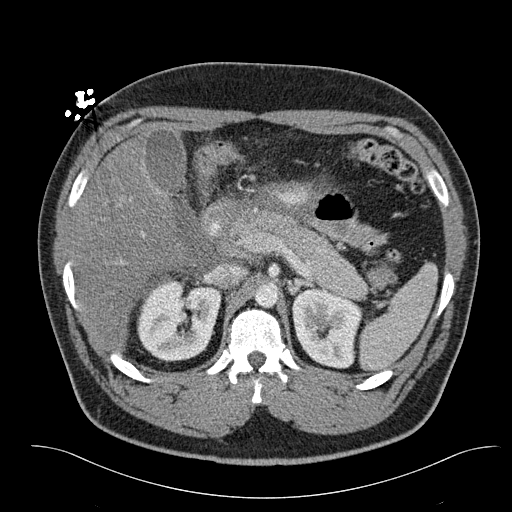
[im 85/130  soft-tissue]
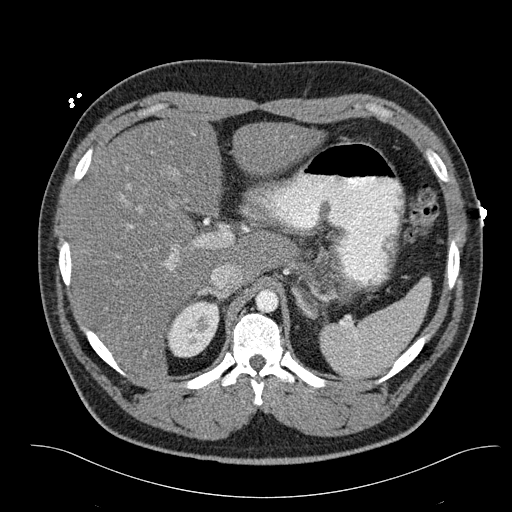
[im 85/130  bone]
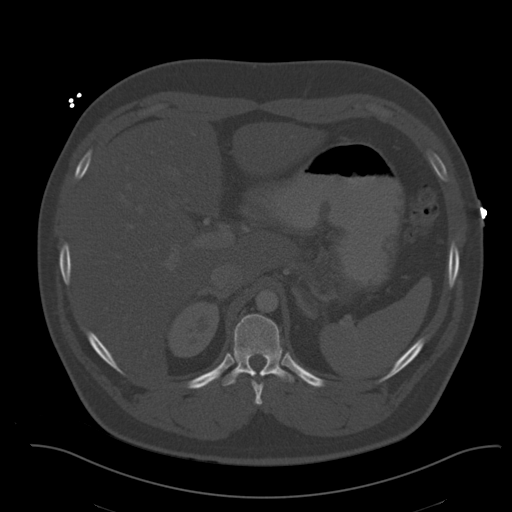
[im 95/130  soft-tissue]
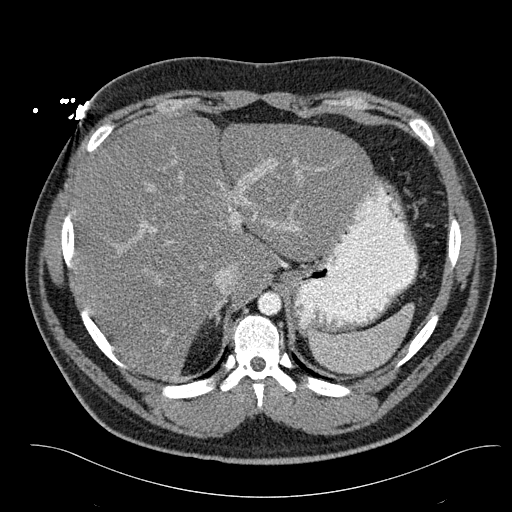
[im 105/130  soft-tissue]
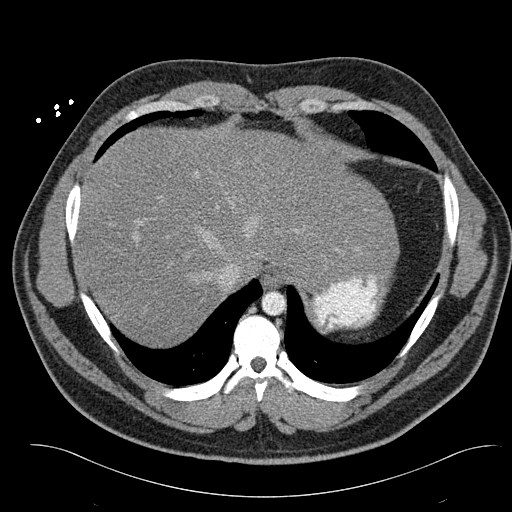
[im 110/130  lung]
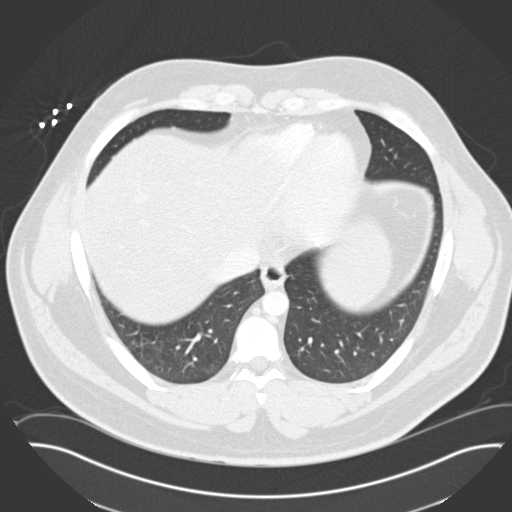
[im 115/130  soft-tissue]
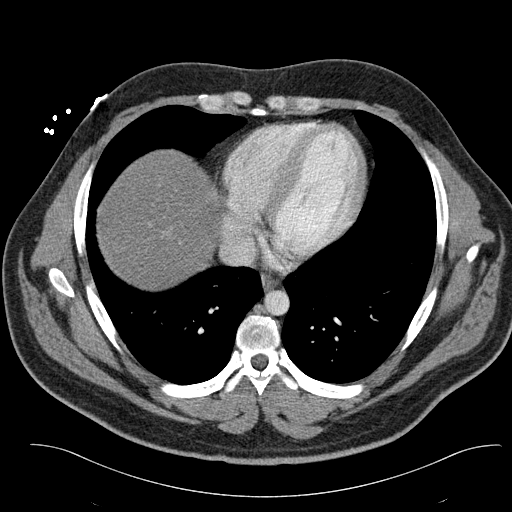
[im 115/130  lung]
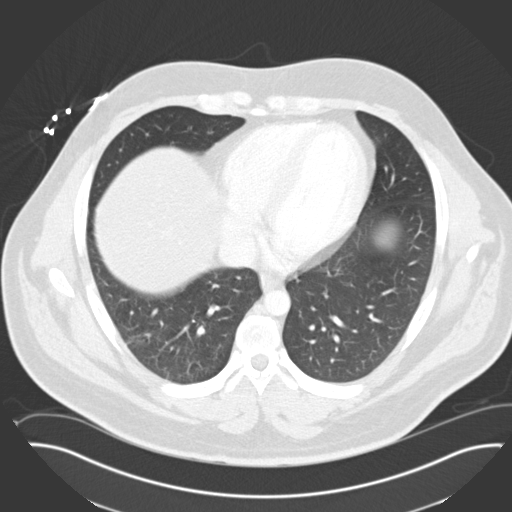
[im 120/130  lung]
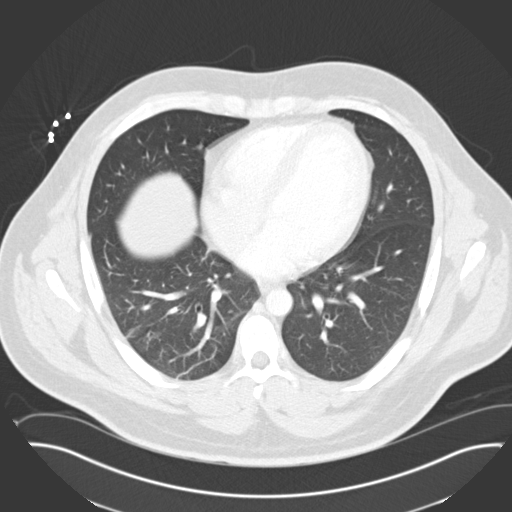
[im 125/130  soft-tissue]
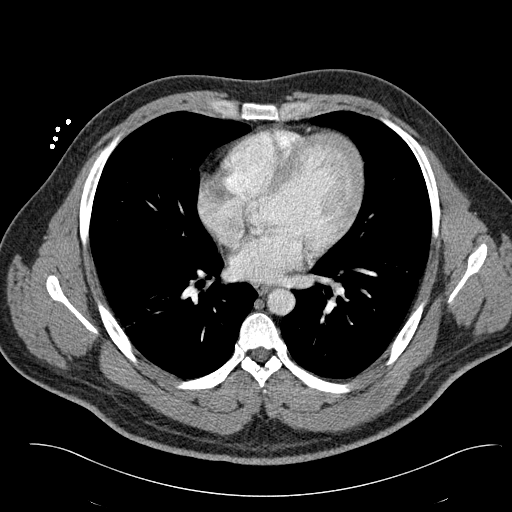
[im 125/130  lung]
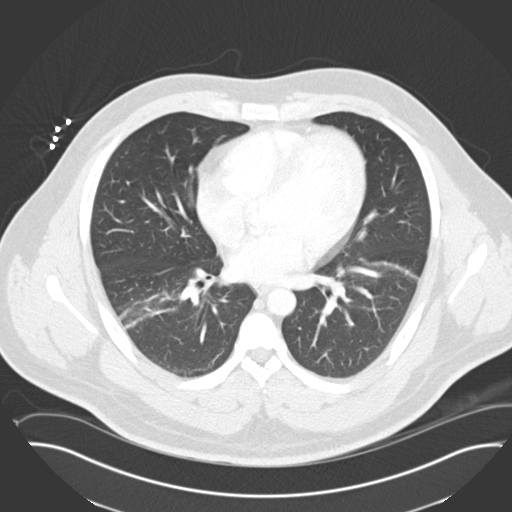

[15 of 32 positions shown; findings below may reference images not displayed]

FINDINGS: The lung bases are clear. There is no pneumothorax. The heart size is
normal.

The liver is diffusely low in attenuation likely secondary to hepatic
steatosis. There is no intrahepatic or extrahepatic biliary ductal
dilatation. The gallbladder is unremarkable. The spleen demonstrates no
focal abnormality. The kidneys and adrenal glands are unremarkable.

There is severe peripancreatic inflammatory change and fluid surrounding the
pancreatic head. There are areas of hypoenhancement within the pancreatic
head concerning for pancreatic necrosis. The body and pancreatic tail
enhance normally and homogeneously. There are reactive changes within the
duodenum resulting in bowel wall thickening and possible gastric outlet
obstruction. There are reactive changes involving the hepatic flexure.

The visualized portions of the stomach, duodenum, small intestine, and large
intestine demonstrate no contrast extravasation or dilatation. There is no
pneumoperitoneum, pneumatosis, or portal venous gas. There is no abdominal
free fluid. There is no lymphadenopathy.

The abdominal aorta is normal in caliber.

The osseous structures are unremarkable.
IMPRESSION: 1. There is severe peripancreatic inflammatory change and fluid surrounding
the pancreatic head most consistent with pancreatitis. There are areas of
hypoenhancement within the pancreatic head concerning for pancreatic
necrosis.

2. Hepatic steatosis.

## 2010-03-12 ENCOUNTER — Inpatient Hospital Stay: Payer: Self-pay | Admitting: Internal Medicine

## 2010-03-12 IMAGING — CR DG CHEST 1V PORT
1 series · 1 of 1 positions shown · non-contrast
Comparison: none

REASON FOR EXAM: abd pain
COMMENTS:

[view not recorded]
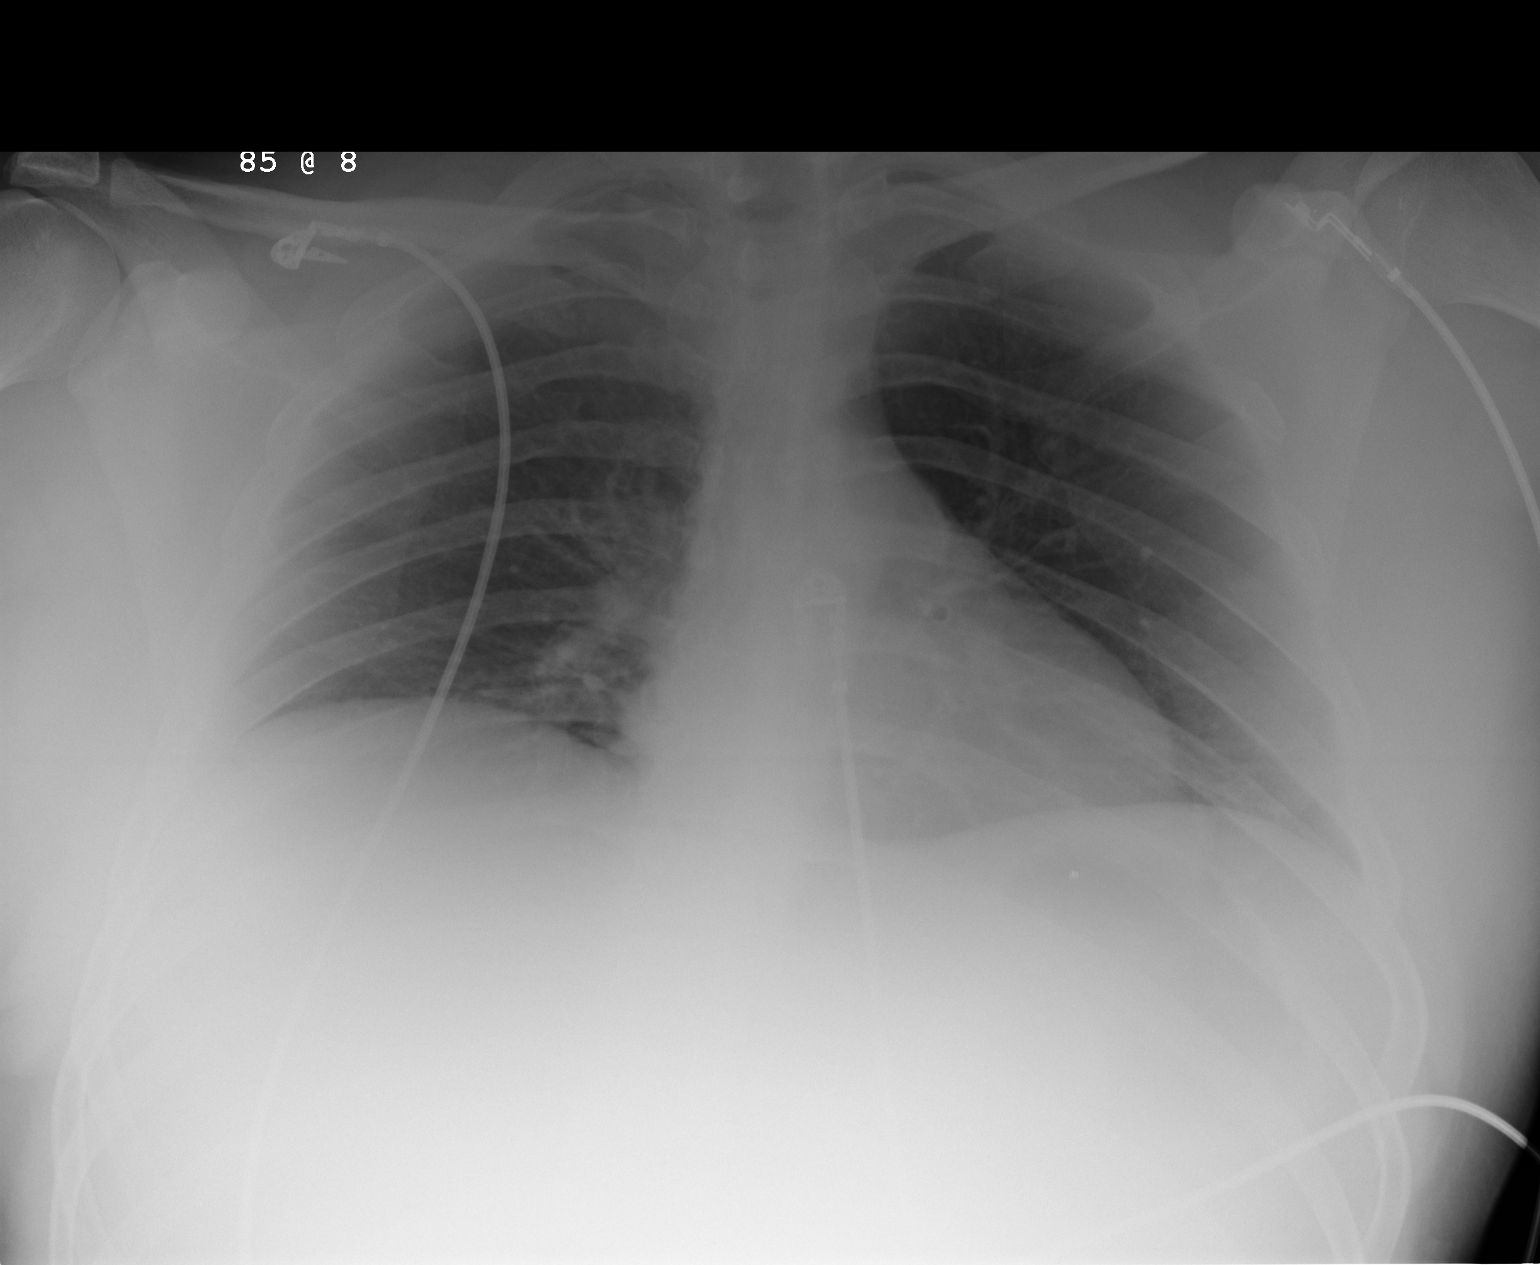

[1 of 1 positions shown; findings below may reference images not displayed]

PROCEDURE:     DXR - DXR PORTABLE CHEST SINGLE VIEW  - [DATE]  [DATE]

RESULT:     Comparison is made to the study of [DATE].

Cardiac monitoring electrodes are present. The lungs are clear. The heart
and pulmonary vessels are normal. The bony and mediastinal structures are
unremarkable. There is no effusion. There is no pneumothorax or evidence of
congestive failure.
IMPRESSION: No acute cardiopulmonary disease. Stable appearance.

## 2010-07-22 ENCOUNTER — Inpatient Hospital Stay: Payer: Self-pay | Admitting: Specialist

## 2011-03-29 ENCOUNTER — Inpatient Hospital Stay: Payer: Self-pay | Admitting: Internal Medicine

## 2011-03-29 LAB — LIPASE, BLOOD: Lipase: 2578 U/L — ABNORMAL HIGH (ref 73–393)

## 2011-03-29 LAB — CBC
HCT: 43.5 % (ref 40.0–52.0)
HGB: 14.4 g/dL (ref 13.0–18.0)
MCHC: 33.1 g/dL (ref 32.0–36.0)
MCV: 83 fL (ref 80–100)
Platelet: 197 10*3/uL (ref 150–440)
RDW: 13.9 % (ref 11.5–14.5)

## 2011-03-29 LAB — LIPID PANEL
Cholesterol: 267 mg/dL — ABNORMAL HIGH (ref 0–200)
HDL Cholesterol: 16 mg/dL — ABNORMAL LOW (ref 40–60)
Triglycerides: 3591 mg/dL — ABNORMAL HIGH (ref 0–200)

## 2011-03-29 LAB — COMPREHENSIVE METABOLIC PANEL
Anion Gap: 19 — ABNORMAL HIGH (ref 7–16)
Bilirubin,Total: 0.5 mg/dL (ref 0.2–1.0)
Calcium, Total: 9 mg/dL (ref 8.5–10.1)
Chloride: 99 mmol/L (ref 98–107)
Co2: 21 mmol/L (ref 21–32)
EGFR (African American): >60
EGFR (Non-African Amer.): >60
Potassium: 4.5 mmol/L (ref 3.5–5.1)
SGOT(AST): 40 U/L — ABNORMAL HIGH (ref 15–37)
SGPT (ALT): 42 U/L
Sodium: 139 mmol/L (ref 136–145)
Total Protein: 8.4 g/dL — ABNORMAL HIGH (ref 6.4–8.2)

## 2011-03-29 LAB — CK-MB: CK-MB: 1.3 ng/mL (ref 0.5–3.6)

## 2011-03-29 LAB — URINALYSIS, COMPLETE
Bacteria: NONE SEEN
Leukocyte Esterase: NEGATIVE
RBC,UR: 1 /HPF (ref 0–5)
Squamous Epithelial: NONE SEEN
WBC UR: <1 /HPF (ref 0–5)

## 2011-03-29 LAB — FOLATE: Folic Acid: 17.3 ng/mL (ref 3.1–100.0)

## 2011-03-29 LAB — ETHANOL
Ethanol %: <0.003 % (ref 0.000–0.080)
Ethanol: <3 mg/dL

## 2011-03-29 LAB — TROPONIN I: Troponin-I: <0.02 ng/mL

## 2011-03-29 IMAGING — US ABDOMEN ULTRASOUND LIMITED
1 series · 17 of 25 positions shown · non-contrast
Comparison: none

REASON FOR EXAM: pain, pancreatitis
COMMENTS:   Body Site: GB and Fossa, CBD, Head of Pancreas

PROCEDURE:     US  - US ABDOMEN LIMITED SURVEY  - [DATE]  [DATE]
RESULT:     Comparison: [DATE]
TECHNIQUE: Multiple grayscale and color Doppler images were obtained of the
right upper quadrant.

[Series 1: abdomen ultrasound limited · 17 of 50 slices shown]
[im 1/50]
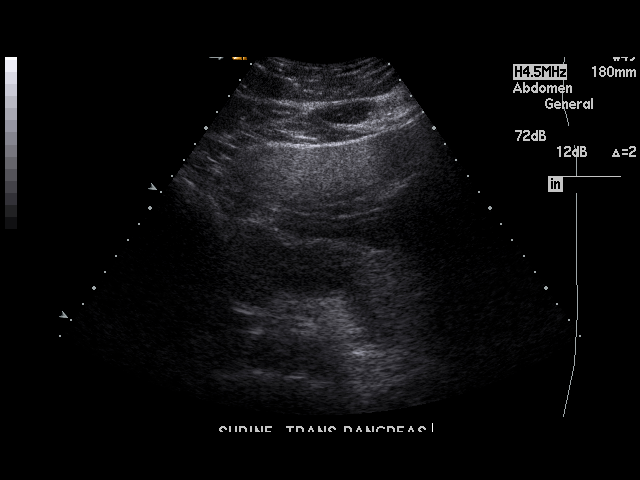
[im 5/50]
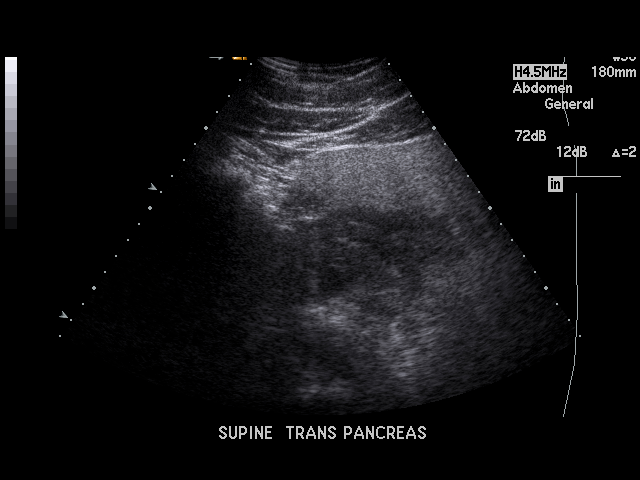
[im 7/50]
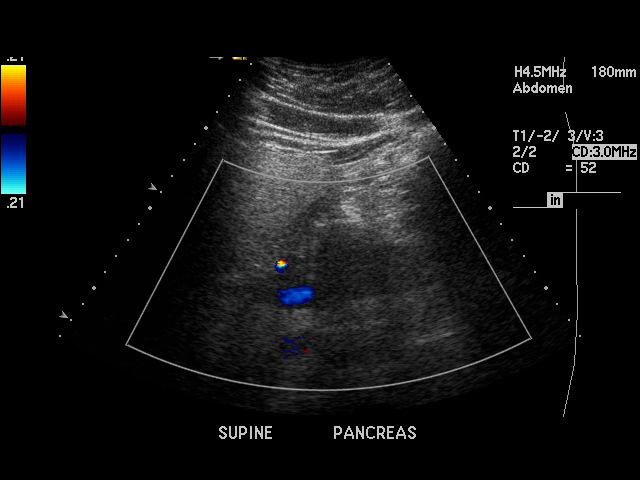
[im 11/50]
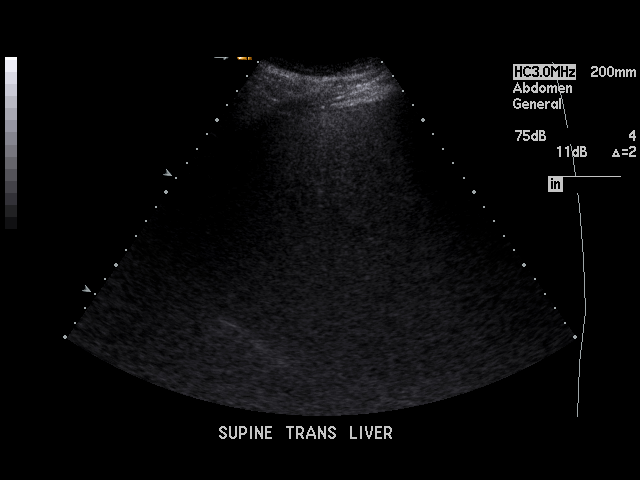
[im 13/50]
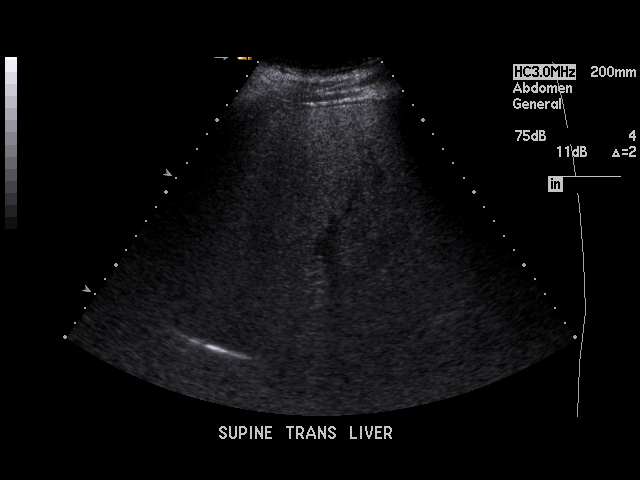
[im 17/50]
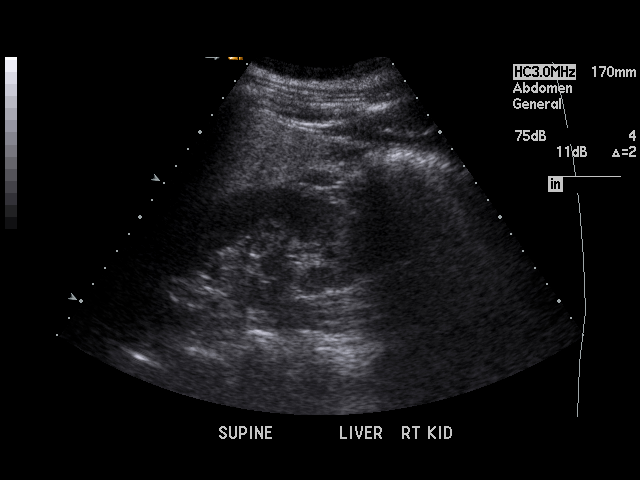
[im 19/50]
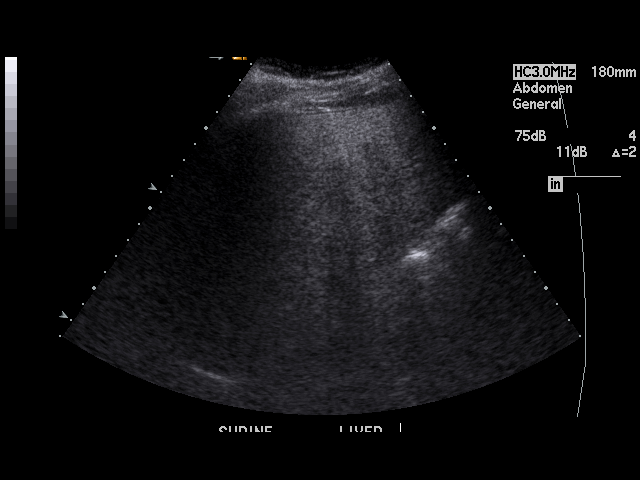
[im 23/50]
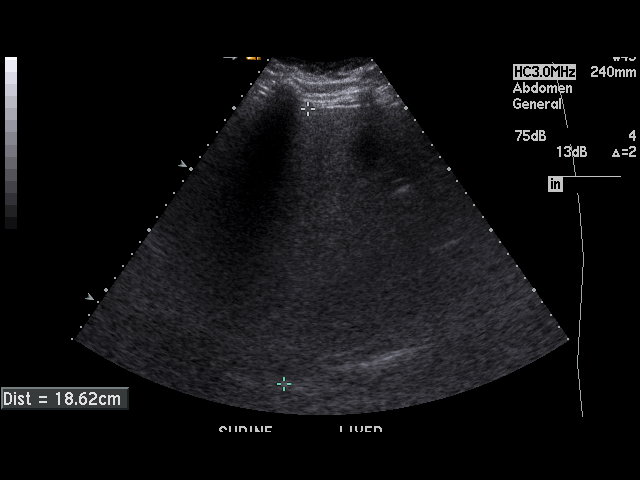
[im 25/50]
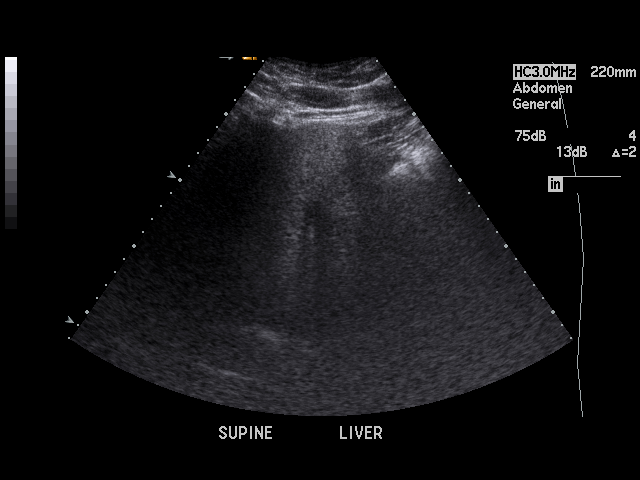
[im 27/50]
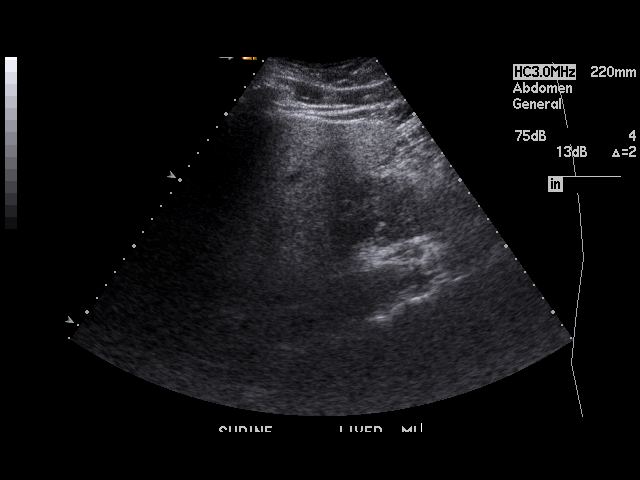
[im 31/50]
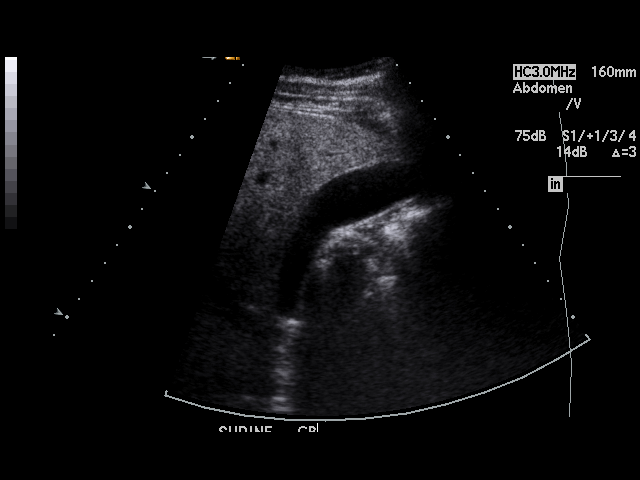
[im 33/50]
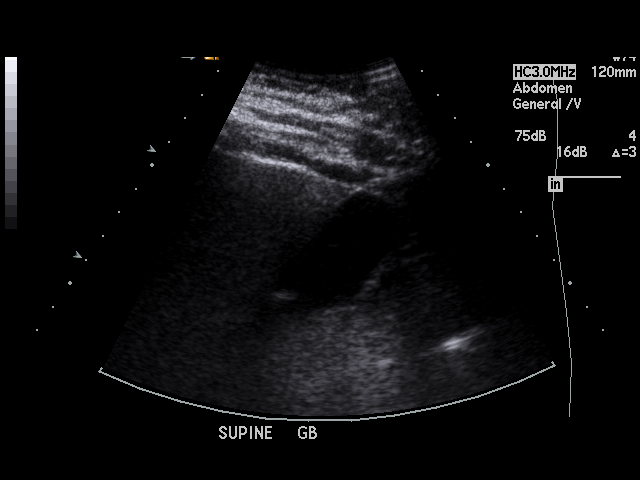
[im 37/50]
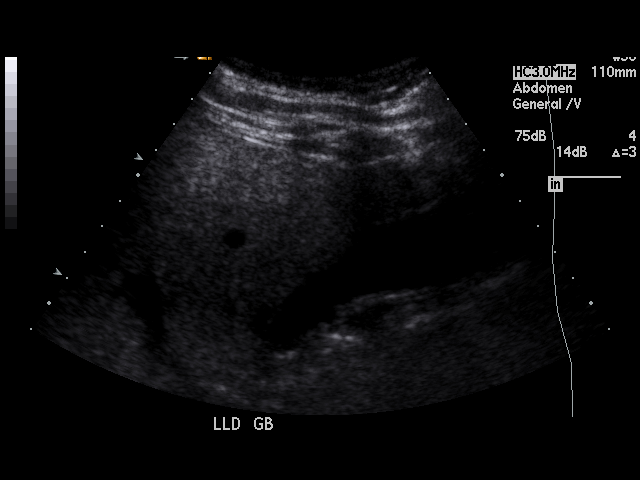
[im 39/50]
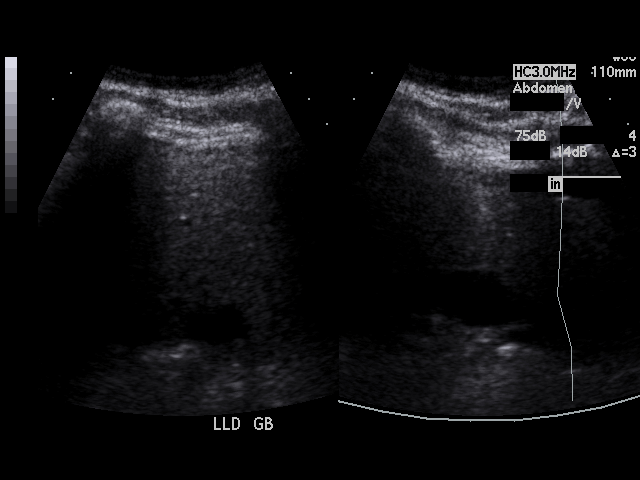
[im 43/50]
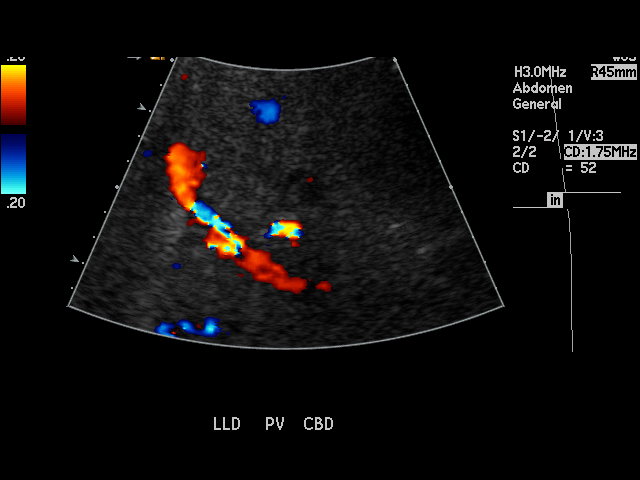
[im 45/50]
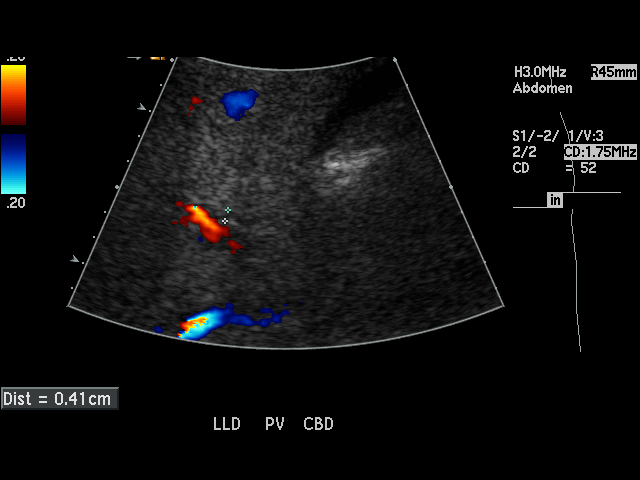
[im 50/50]
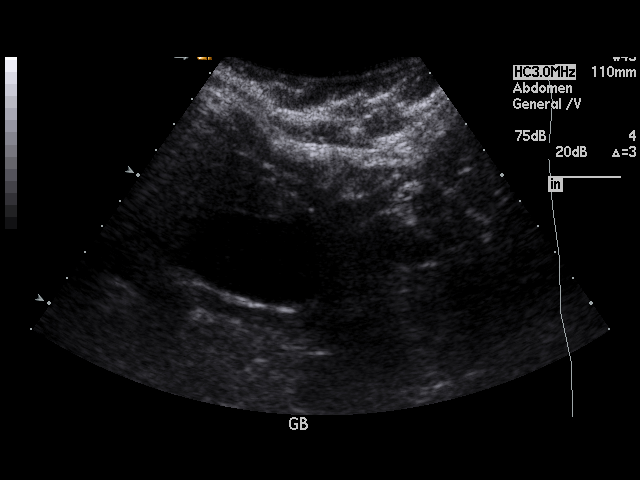

[17 of 25 positions shown; findings below may reference images not displayed]

FINDINGS: The liver is echogenic and dense, suggesting hepatic steatosis. The pancreas
is not well seen secondary to overlying bowel gas. The gallbladder is
normal. Sonographic Murphy sign was negative. The common bile duct measures
4 mm.
IMPRESSION: 1. Normal gallbladder.
2. Hepatic steatosis.
3. The pancreas was obscured, preventing its evaluation.

## 2011-03-30 LAB — COMPREHENSIVE METABOLIC PANEL
Albumin: 3.2 g/dL — ABNORMAL LOW (ref 3.4–5.0)
Alkaline Phosphatase: 51 U/L (ref 50–136)
BUN: 19 mg/dL — ABNORMAL HIGH (ref 7–18)
Bilirubin,Total: 0.6 mg/dL (ref 0.2–1.0)
Chloride: 108 mmol/L — ABNORMAL HIGH (ref 98–107)
Creatinine: 0.99 mg/dL (ref 0.60–1.30)
EGFR (African American): >60
Glucose: 340 mg/dL — ABNORMAL HIGH (ref 65–99)
SGOT(AST): 47 U/L — ABNORMAL HIGH (ref 15–37)
SGPT (ALT): 34 U/L
Total Protein: 7.6 g/dL (ref 6.4–8.2)

## 2011-03-30 LAB — CBC WITH DIFFERENTIAL/PLATELET
HCT: 50.8 % (ref 40.0–52.0)
Lymphocyte #: 1.2 10*3/uL (ref 1.0–3.6)
MCH: 27.6 pg (ref 26.0–34.0)
MCV: 84 fL (ref 80–100)
Monocyte #: 0.7 10*3/uL (ref 0.0–0.7)
Neutrophil #: 9.6 10*3/uL — ABNORMAL HIGH (ref 1.4–6.5)
Platelet: 203 10*3/uL (ref 150–440)
RDW: 14.6 % — ABNORMAL HIGH (ref 11.5–14.5)

## 2011-03-30 LAB — TROPONIN I: Troponin-I: <0.02 ng/mL

## 2011-03-30 LAB — CK-MB: CK-MB: <0.5 ng/mL — ABNORMAL LOW (ref 0.5–3.6)

## 2012-02-21 ENCOUNTER — Emergency Department: Payer: Self-pay | Admitting: Internal Medicine

## 2012-09-09 ENCOUNTER — Emergency Department: Payer: Self-pay | Admitting: Emergency Medicine

## 2012-09-26 ENCOUNTER — Emergency Department: Payer: Self-pay | Admitting: Emergency Medicine

## 2014-04-27 ENCOUNTER — Emergency Department: Payer: Self-pay | Admitting: Emergency Medicine

## 2014-06-22 NOTE — Consult Note (Signed)
Chief Complaint:   Subjective/Chief Complaint Feels better with improvement in abdominal pain. No vomiting.   VITAL SIGNS/ANCILLARY NOTES: **Vital Signs.:   30-Jan-13 08:20   Vital Signs Type Routine   Temperature Temperature (F) 97.5   Celsius 36.3   Temperature Source oral   Pulse Pulse 120   Pulse source per Dinamap   Respirations Respirations 20   Systolic BP Systolic BP 106   Diastolic BP (mmHg) Diastolic BP (mmHg) 95   Mean BP 110   BP Source Dinamap   Pulse Ox % Pulse Ox % 95   Pulse Ox Activity Level  At rest   Oxygen Delivery Room Air/ 21 %   Routine Hem:  30-Jan-13 06:03    WBC (CBC) 11.5   RBC (CBC) 6.02   Hemoglobin (CBC) 16.6   Hematocrit (CBC) 50.8   Platelet Count (CBC) 203   MCV 84   MCH 27.6   MCHC 32.7   RDW 14.6  Routine Chem:  30-Jan-13 06:03    Lipase -   Glucose, Serum -   BUN -   Creatinine (comp) -   Sodium, Serum -   Potassium, Serum -   Chloride, Serum -   CO2, Serum -   Calcium (Total), Serum -  Hepatic:  30-Jan-13 06:03    Bilirubin, Total -   Alkaline Phosphatase -   SGPT (ALT) -   SGOT (AST) -   Total Protein, Serum -   Albumin, Serum -  Routine Chem:  30-Jan-13 06:03    Osmolality (calc) -   eGFR (African American) -   eGFR (Non-African American) -   Anion Gap -  Cardiac:  30-Jan-13 06:03    Troponin I < 0.02   CPK-MB, Serum < 0.5  Routine Hem:  30-Jan-13 06:03    Neutrophil % 83.4   Lymphocyte % 10.1   Monocyte % 6.2   Eosinophil % 0.1   Basophil % 0.2   Neutrophil # 9.6   Lymphocyte # 1.2   Monocyte # 0.7   Eosinophil # 0.0   Basophil # 0.0  Routine Chem:  30-Jan-13 07:24    Lipase 878   Glucose, Serum 340   BUN 19   Creatinine (comp) 0.99   Sodium, Serum 140   Potassium, Serum 4.6   Chloride, Serum 108   CO2, Serum 17   Calcium (Total), Serum 8.0  Hepatic:  30-Jan-13 07:24    Bilirubin, Total 0.6   Alkaline Phosphatase 51   SGPT (ALT) 34   SGOT (AST) 47   Total Protein, Serum 7.6   Albumin,  Serum 3.2  Routine Chem:  30-Jan-13 07:24    Osmolality (calc) 295   eGFR (African American) >60   eGFR (Non-African American) >60   Anion Gap 15   Assessment/Plan:  Assessment/Plan:   Assessment Acute pancreatitis, improving. No evidence of GI bleed.    Plan Clear liquid diet. Will follow.   Electronic Signatures: Jill Side (MD)  (Signed 30-Jan-13 10:51)  Authored: Chief Complaint, VITAL SIGNS/ANCILLARY NOTES, Lab Results, Assessment/Plan   Last Updated: 30-Jan-13 10:51 by Jill Side (MD)

## 2014-06-22 NOTE — Consult Note (Signed)
Brief Consult Note: Diagnosis: Acute pancreatitis.   Comments: Patient with acute pancreatitis most likely secondary to high triglycerides. ? V. tach. Minimal UGI bleed most likely seondary to Mallory-Weiss tear. No signs of active bleeding.  Recommendations: NPO. IV hydration and pain control. Cardiology consult. Will follow.  Electronic Signatures: Lurline DelIftikhar, Leaira Fullam (MD)  (Signed 29-Jan-13 18:18)  Authored: Brief Consult Note   Last Updated: 29-Jan-13 18:18 by Lurline DelIftikhar, Ki Luckman (MD)

## 2014-06-22 NOTE — Discharge Summary (Signed)
PATIENT NAME:  Donald Carpenter, Donald Carpenter MR#:  [id number] DATE OF BIRTH:  11/26/1975  DATE OF ADMISSION:  03/29/2011 DATE OF DISCHARGE:  03/31/2011  DISCHARGE DIAGNOSES:  1. 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  2. Hyperlipidemia. 3. Hypertriglyceridemia. 4. Hypertension. 5. Diabetes.  6. Arrhythmia with ventricular tachycardia.  7. Hypomagnesemia, resolved.  8. Gastroesophageal reflux disease.   9. Obesity. 10. Tobacco abuse.   DISPOSITION: [facility]   LABORATORY, DIAGNOSTIC AND RADIOLOGICAL DATA: Abdominal ultrasound: Normal gallbladder, hepatic steatosis. Vitamin B12 normal. Urinalysis showed no evidence of infection. White count 11.3. Normal platelet and hemoglobin. Glucose 340, triglycerides 3591, cholesterol 267, lipase 2578 on admission. Hemoglobin A1c 11.4. Mildly elevated AST 40, troponin negative.   CONSULTANT: GI consultation with Dr. Niel HummerIftikhar.   HOSPITAL COURSE: Patient is a 39 year old male with past medical history of recurrent pancreatitis, hypertriglyceridemia, poorly controlled diabetes, hypertension, gastroesophageal reflux disease, who presented with abdominal pain. Patient was found to have recurrent pancreatitis and this was felt to be due to elevated triglycerides. His lipase level was more than 2000 on admission. With conservative management his lipase trended down to 800. He was seen by gastroenterologist and started on a clear liquid diet after being initially kept n.p.o. and treated with IV fluids, p.r.n. analgesics and antiemetics. His abdominal ultrasound showed fatty liver. His triglycerides are more than 3000. He also has elevated cholesterol. He has been advised to continue TriCor, niacin and fish oil. He also has poorly controlled diabetes with hemoglobin A1c of 11.8. Initially on presentation patient had one episode of arrhythmia/ventricular tachycardia which was felt to be due to low magnesium. He  was monitored on telemetry and subsequently remained in normal sinus rhythm. [facility]. The case was discussed with Dr. Morrie Sheldonay who accepted the patient in transfer. [facility] in [facility].   TIME SPENT: 45 minutes.   ____________________________ Darrick MeigsSangeeta Random Dobrowski, MD sp:cms D: 03/31/2011 16:21:54 ET T: 04/01/2011 13:44:04 ET JOB#: [id number]  cc: Darrick MeigsSangeeta Caelum Federici, MD, <Dictator>  Darrick MeigsSANGEETA Miloh Alcocer MD ELECTRONICALLY SIGNED 04/01/2011 14:35

## 2014-06-22 NOTE — Consult Note (Signed)
PATIENT NAME:  Donald Carpenter, [name] MR#:  [id number] DATE OF BIRTH:  [birth year]  DATE OF CONSULTATION:  03/30/2011  REFERRING PHYSICIAN:  Katharina Caperima Vaickute, MD  CONSULTING PHYSICIAN:  Donald DelShaukat Torrian Canion, MD  REASON FOR CONSULTATION: Acute pancreatitis.   HISTORY OF PRESENT ILLNESS: This is a 39 year old male with history of recurrent pancreatitis in the past. The patient denies any history of alcohol. He has no documented biliary pathology. The patient was brought to the hospital again with severe abdominal pain as well as nausea and vomiting. Streaks of blood were reported in the vomitus as well. The patient was admitted with a diagnosis of acute pancreatitis as his lipase was more than 3000 as well as a diagnosis of questionable upper GI bleed. The patient was evaluated yesterday as well as today. He has had no further nausea or vomiting. He has had no melena, hematochezia, or hematemesis. His abdominal pain, which was mainly periumbilical and epigastric, seems to be improving. He denies any other significant symptoms.   PAST MEDICAL HISTORY:  1. Recurrent acute pancreatitis thought to be secondary to hypertriglyceridemia.  2. History of hypertension. 3. Hyperlipidemia. 4. Obesity. 5. Gastroesophageal reflux disease.  6. History of DKA in the past.   MEDICATIONS AT HOME:  1. Protonix. 2. Novolin. 3. Lantus. 4. Sliding scale insulin. 5. Aspirin. 6. Niacin. 7. Fish Oil. 8. Vasotec.   There appears to be some concerns about compliance as the patient's hemoglobin A1c was reported to be more than 12.   PAST SURGICAL HISTORY: Unremarkable.  SOCIAL HISTORY: He smokes about half a pack a day. He does not drink.   ALLERGIES: None.  REVIEW OF SYSTEMS: Grossly negative except for what is mentioned in the history of present illness.    PHYSICAL EXAMINATION:   GENERAL: Obese male who does not appear to be in any acute distress.   VITAL SIGNS: His vitals are stable and he is afebrile.   NECK:  Neck veins are flat.   LUNGS: Clear to auscultation bilaterally with fair air entry. No added sounds.   CARDIOVASCULAR: Regular rate and rhythm. No gallops or murmurs were heard.   ABDOMEN: Slightly distended, soft, nontender except for some tenderness in the epigastric region without any rebound or guarding.   EXTREMITIES: No edema, clubbing, or cyanosis.   NEUROLOGIC: Appears to be unremarkable.   LABORATORY, DIAGNOSTIC, AND RADIOLOGICAL DATA: Hemoglobin 15.5 yesterday, 16.6 today. White cell count is stable at around 11.3. Lipase was more than 3000 on admission, is 878 today. Liver enzymes are unremarkable. AST is minimally elevated at 47. Creatinine 0.99. Ultrasound of right upper quadrant showed normal gallbladder, hepatic steatosis, otherwise unremarkable.   ASSESSMENT AND PLAN: The patient is with acute pancreatitis most likely secondary to hypertriglyceridemia. His triglycerides were more than 3000. The patient denies alcohol and there does not appear to be any biliary pathology on ultrasound. He clinically seems to be improving with less abdominal pain and resolution of nausea and vomiting. I will start him on a clear liquid diet. Will continue to control his hypertriglyceridemia aggressively. As there is no evidence of active GI bleeding and his hemoglobin and hematocrit is normal, minimal streaks of blood seen in the vomitus were probably secondary to a very small Mallory-Weiss tear. Continue present management.  Will follow.   ____________________________ Donald DelShaukat Daran Favaro, MD si:drc D: 03/30/2011 10:56:13 ET T: 03/30/2011 11:06:20 ET JOB#: [id number]  cc: Donald DelShaukat Krayton Wortley, MD, <Dictator> Donald DelSHAUKAT Marcia Hartwell MD ELECTRONICALLY SIGNED 04/14/2011 12:57

## 2014-06-22 NOTE — H&P (Signed)
PATIENT NAME:  Donald Carpenter, Yuriy MR#:  [id number] DATE OF BIRTH:  December 21, 1975  DATE OF ADMISSION:  03/29/2011  PRIMARY CARE PHYSICIAN: [facility]   HISTORY OF PRESENT ILLNESS: Patient is a 39 year old African American male with history of pancreatitis, was admitted in May 2012 at [facility], presented back to the hospital with complaints of periumbilical abdominal pains. According to patient as well as patient's girlfriend who is present during my interview, patient has been having problems with abdominal pain since yesterday. He is having nausea as well as vomiting. He vomited at least eight times at home and some in the Emergency Room. His vomitus was accompanied by some bright red blood at home, streaks of bright red blood. According to patient's girlfriend it was at least an ounce of blood. He was also complaining of back pain. Patient himself is not able to provide much history because he is very somnolent after he was given opiates here for pain in the Emergency Room. While in the Emergency Room patient had episodes of ventricular tachycardia requiring amiodarone infusion. Initial EKG showed atrial fibrillation with aberrant conduction as well as ventricular tachycardia and repeated EKG showed sinus tachycardia with no significant ST-T changes.   PAST MEDICAL HISTORY:  1. History of acute pancreatitis, admission for the same in May 2012. It was felt to be due to hypertriglyceridemia.  2. History of hyperlipidemia, hypertriglyceridemia. 3. Poorly controlled diabetes mellitus insulin-dependent. 4. Hypertension. 5. Gastroesophageal reflux disease.  6. Obesity.  7. Diabetic ketoacidosis with multiple admission for the same. He was admitted in January 2012 with diabetic ketoacidosis as well as acute pancreatitis.  8. History of tobacco abuse.  MEDICATIONS: Patient was discharged in May 2012 on:  1. Fenofibrate 145 mg p.o. daily. 2. Protonix 40 mg p.o. daily.  3. Sliding scale  insulin. 4. Novolin 60 units 3 times daily.  5. Lantus 40 units twice daily.  6. Aspirin 81 mg p.o. daily. 7. Niacin 1000 mg p.o. at bedtime.  8. Fish oil 2 grams daily. 9. Vasotec 5 mg p.o. daily. It is unclear what medications he is taking now as he does not have his medication list with himself.   PAST SURGICAL HISTORY: None.   SOCIAL HISTORY: Smokes approximately 15 cigarettes a day for 15 years. Denied alcohol or illicit drugs recently. According to patient's girlfriend he quit drinking alcohol approximately six years ago.   FAMILY HISTORY: Diabetes mellitus as well as hypertension. Patient's mother also had congestive heart failure. No history of cancer.   DRUG ALLERGIES: No known drug allergies.   REVIEW OF SYSTEMS: Unable to obtain review of systems due to significant somnolence here in the Emergency Room after medications were given for him.   PHYSICAL EXAMINATION:  VITAL SIGNS: On arrival to the Emergency Room patient's vitals: Temperature 97.9, pulse 75, respiration rate 18, blood pressure 140/83, saturation 99% on room air.   GENERAL: This is a well-nourished obese African American male in no significant distress, but very somnolent lying on the stretcher.   HEENT: His pupils are equal, reactive to light. Extraocular movements are intact. No conjunctivitis. He is barely able to open his eyes. No pharyngeal erythema. Mucosa is moist.  NECK: Neck did not reveal any masses, supple, nontender. Thyroid is not enlarged. No adenopathy. No JVD or carotid bruits bilaterally. Full range of motion.   LUNGS: Clear to auscultation in all fields. Diminished breath sounds due to poor effort. No rales, rhonchi, or wheezing. No labored respirations, increased effort, dullness  to percussion, or overt respiratory distress noted.   CARDIOVASCULAR: S1, S2 appreciated. No murmurs, gallops or rubs noted. Point of maximal impulse not lateralized. Rhythm was regular. Chest is nontender to palpation.    EXTREMITIES: 1+ pedal pulses. No lower extremity edema, calf tenderness, or cyanosis noted.   ABDOMEN: Soft, tender diffusely, especially in epigastric as well as periumbilical area. Some guarding was noted. No hepatosplenomegaly or masses are noted.   RECTAL: Deferred.   MUSCULOSKELETAL: Able to move all extremities. No cyanosis, degenerative joint disease, or kyphosis. Gait is not tested.   SKIN: Skin did not reveal any rashes, lesions, erythema, nodularity, induration. It was warm and dry to palpation.   LYMPH: No adenopathy in cervical region.   NEUROLOGICAL: Cranial nerves grossly intact. Sensory grossly intact; however, difficult to evaluate due to significant somnolence. Patient is somnolent, poorly cooperative. Not able to assess his memory.  LABORATORY, DIAGNOSTIC AND RADIOLOGICAL DATA: BMP done 03/29/2011 showed glucose 369, anion gap 19, bicarbonate level 21, magnesium level 1.6, lipase level 2578. Alcohol level less than 0.003%. Total protein elevated at 8.4, AST elevated to 40, otherwise unremarkable liver enzymes. Cardiac enzymes, first set negative. CBC is pending. Urinalysis: Clear yellow urine, more than 500 glucose, negative for bilirubin, 2+ ketones, specific gravity 1.032, pH 5.0, 1+ blood, 100 mg/dL protein, negative for nitrites or leukocyte esterase, 1 red blood cell as well as 1 white blood cell, no bacteria or epithelial cells are seen. ABGs were done on 30% FiO2 showed pH 7.32, pO2 40, pO2 50, saturation 81.5% on 30% FiO2 with bicarbonate level 20.6 and base excess of -5.2. Lactic acid level is elevated at 1.5. EKG done in the Emergency Room showed V. tach for a few seconds, also atrial fibrillation, rapid ventricular response at rate of 170 beats per minute, aberrant conduction complexes. Repeat EKG showed sinus tachycardia at 111 beats per minute, left axis deviation, no significant ST-T changes and that is different from prior EKG done in May 2012. At that time patient  had significant ST depressions, T inversions in anterior leads. Of note, he underwent stress test in [facility] on 01/07/2010. At that time his stress test was unremarkable, however, a little bit depressed left ventricular function was noted but no evidence of stress-induced myocardial ischemia.   Radiologic studies on this admission: Ultrasound of abdomen is pending.   ASSESSMENT AND PLAN:  1. 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 Patient had ultrasound in May 2010. At that time he had no gallstones, however, will repeat ultrasound to rule out gallstones. He may benefit from cholecystectomy.  2. History of hypertriglyceridemia. Check lipid panel. Continue patient on fenofibrate, niacin as well as fish oil.  3. History of hypertension. Continue Vasotec.  4. History of diabetes mellitus, type 2, insulin-dependent. Continue Lantus. Get hemoglobin A1c.   5. Arrhythmia, V. tach. Will get cardiologist involved. Will ask cardiology for further evaluation. I am not sure if my concern is that arrhythmia could have been related to hypomagnesemia, however, I am also concerned with patient's prior stress test which revealed global cardiac dysfunction and ischemic cardiac disease is possible so patient may benefit from cardiac catheterization. 6. Hypomagnesemia. Supplement IV.   TIME SPENT: One hour.   ____________________________ Katharina Caper, MD rv:cms D: 03/29/2011 07:59:52 ET T: 03/29/2011 09:48:48 ET JOB#: [id number]  cc: Katharina Caper, MD, <Dictator> [facility] Payslie Mccaig MD ELECTRONICALLY SIGNED 04/10/2011 20:24

## 2015-10-19 ENCOUNTER — Encounter: Payer: Self-pay | Admitting: Emergency Medicine

## 2015-10-19 ENCOUNTER — Emergency Department
Admission: EM | Admit: 2015-10-19 | Discharge: 2015-10-19 | Disposition: A | Discharge status: Home / Self Care | Payer: Self-pay | Attending: Student | Admitting: Student

## 2015-10-19 ENCOUNTER — Emergency Department: Payer: Self-pay

## 2015-10-19 DIAGNOSIS — L03115 Cellulitis of right lower limb: Secondary | ICD-10-CM | POA: Insufficient documentation

## 2015-10-19 DIAGNOSIS — F17200 Nicotine dependence, unspecified, uncomplicated: Secondary | ICD-10-CM | POA: Insufficient documentation

## 2015-10-19 DIAGNOSIS — E119 Type 2 diabetes mellitus without complications: Secondary | ICD-10-CM | POA: Insufficient documentation

## 2015-10-19 HISTORY — DX: Type 2 diabetes mellitus without complications: E11.9

## 2015-10-19 LAB — BASIC METABOLIC PANEL
Anion gap: 8 (ref 5–15)
BUN: 13 mg/dL (ref 6–20)
CHLORIDE: 101 mmol/L (ref 101–111)
CO2: 26 mmol/L (ref 22–32)
CREATININE: 0.76 mg/dL (ref 0.61–1.24)
Calcium: 8.5 mg/dL — ABNORMAL LOW (ref 8.9–10.3)
GFR calc Af Amer: >60 mL/min (ref >60)
GFR calc non Af Amer: >60 mL/min (ref >60)
GLUCOSE: 365 mg/dL — AB (ref 65–99)
Potassium: 3.9 mmol/L (ref 3.5–5.1)
SODIUM: 135 mmol/L (ref 135–145)

## 2015-10-19 LAB — CBC WITH DIFFERENTIAL/PLATELET
BASOS ABS: 0.1 10*3/uL (ref 0–0.1)
Basophils Relative: 1 %
Eosinophils Absolute: 0.2 10*3/uL (ref 0–0.7)
Eosinophils Relative: 5 %
HEMATOCRIT: 38.3 % — AB (ref 40.0–52.0)
HEMOGLOBIN: 13 g/dL (ref 13.0–18.0)
LYMPHS ABS: 2 10*3/uL (ref 1.0–3.6)
LYMPHS PCT: 42 %
MCH: 28.1 pg (ref 26.0–34.0)
MCHC: 33.9 g/dL (ref 32.0–36.0)
MCV: 82.9 fL (ref 80.0–100.0)
MONO ABS: 0.3 10*3/uL (ref 0.2–1.0)
MONOS PCT: 6 %
Neutro Abs: 2.2 10*3/uL (ref 1.4–6.5)
Neutrophils Relative %: 46 %
PLATELETS: 181 10*3/uL (ref 150–440)
RBC: 4.63 MIL/uL (ref 4.40–5.90)
RDW: 13.5 % (ref 11.5–14.5)
WBC: 4.6 10*3/uL (ref 3.8–10.6)

## 2015-10-19 LAB — URIC ACID: Uric Acid, Serum: 4 mg/dL — ABNORMAL LOW (ref 4.4–7.6)

## 2015-10-19 MED ORDER — CEPHALEXIN 500 MG PO CAPS: 500.0000 mg | ORAL_CAPSULE | Freq: Four times a day (QID) | ORAL | 0 refills | Status: DC

## 2015-10-19 MED ORDER — MELOXICAM 15 MG PO TABS: 15.0000 mg | ORAL_TABLET | Freq: Every day | ORAL | 0 refills | Status: DC

## 2015-10-19 NOTE — ED Notes (Signed)
Called lab checking on uric acid and BMP, lab stated that they were started at 2309 and that they should take 10 more minutes to finish.

## 2015-10-19 NOTE — ED Triage Notes (Signed)
Pt to triage via w/c with no distress noted; pt reports swelling/pain to right lower leg/calf radiating into foot; denies any accomp symptoms

## 2015-10-19 NOTE — ED Provider Notes (Signed)
[facility] Emergency Department Provider Note  ____________________________________________  Time seen: Approximately 9:35 PM  I have reviewed the triage vital signs and the nursing notes.   HISTORY  Chief Complaint Leg Swelling    HPI Donald Carpenter is a 40 y.o. male who presents emergency department complaining of left lower extremity/ankle pain with left lower extremity edema. Patient reports that symptoms have been ongoing times several days. They are getting worse in nature.Patient reports that symptoms initially began in the ankle and have spread up his calf. Patient denies any fevers or chills, chest pain, shortness of breath, nausea or vomiting, decreased sensation or movement and lower extremities. Patient has not been taking medication for this complaint prior to arrival. No history of osteoarthritis, gout, CHF, CAD, PVD, DVT, PE. No recent travel or immobilizations. No previous occurrence of these symptoms in the past.   Past Medical History:  Diagnosis Date  . Diabetes mellitus without complication (HCC)     There are no active problems to display for this patient.   History reviewed. No pertinent surgical history.  Prior to Admission medications   Medication Sig Start Date End Date Taking? Authorizing Provider  cephALEXin (KEFLEX) 500 MG capsule Take 1 capsule (500 mg total) by mouth 4 (four) times daily. 10/19/15   Delorise Royals Israa Caban, PA-C  meloxicam (MOBIC) 15 MG tablet Take 1 tablet (15 mg total) by mouth daily. 10/19/15   Delorise Royals Yexalen Deike, PA-C    Allergies Review of patient's allergies indicates no known allergies.  No family history on file.  Social History Social History  Substance Use Topics  . Smoking status: Current Every Day Smoker  . Smokeless tobacco: Not on file  . Alcohol use Not on file     Review of Systems  Constitutional: No fever/chills Cardiovascular: no chest pain. Respiratory: no cough. No  SOB. Gastrointestinal: No abdominal pain.  No nausea, no vomiting.   Musculoskeletal: Negative for musculoskeletal pain. Skin: Negative for rash, abrasions, lacerations, ecchymosis. Neurological: Negative for headaches, focal weakness or numbness. 10-point ROS otherwise negative.  ____________________________________________   PHYSICAL EXAM:  VITAL SIGNS: ED Triage Vitals  Enc Vitals Group     BP 10/19/15 1857 137/76     Pulse Rate 10/19/15 1857 79     Resp 10/19/15 1857 18     Temp 10/19/15 1857 98.4 F (36.9 C)     Temp Source 10/19/15 1857 Oral     SpO2 10/19/15 1857 100 %     Weight 10/19/15 1857 185 lb (83.9 kg)     Height 10/19/15 1857 6\' 5"  (1.956 m)     Head Circumference --      Peak Flow --      Pain Score 10/19/15 1909 8     Pain Loc --      Pain Edu? --      Excl. in GC? --      Constitutional: Alert and oriented. Well appearing and in no acute distress. Eyes: Conjunctivae are normal. PERRL. EOMI. Head: Atraumatic. Neck: No stridor.   Cardiovascular: Normal rate, regular rhythm. Normal S1 and S2.  Good peripheral circulation. Respiratory: Normal respiratory effort without tachypnea or retractions. Lungs CTAB. Good air entry to the bases with no decreased or absent breath sounds. Musculoskeletal: Full range of motion to all extremities. No gross deformities appreciated. Patient with edema and erythema noted to right lower extremity. Edema is mild when compared with the unaffected extremity. Area is mildly tender to palpation. Area is warm  to palpation. No firmness to palpation. No fluctuance noted to palpation. No drainage is noted from lower extremity. Compartments are soft and pliable. Erythema and edema stretches from mid calf to mid foot. No specific point tenderness. No palpable cords. Full range of motion to ankle. No palpable abnormality to osseous structures. Dorsalis pedis pulse intact and is more twisted sharply. Sensation intact 5 digits and equal to the  unaffected extremity. Neurologic:  Normal speech and language. No gross focal neurologic deficits are appreciated.  Skin:  Skin is warm, dry and intact. No rash noted. Psychiatric: Mood and affect are normal. Speech and behavior are normal. Patient exhibits appropriate insight and judgement.   ____________________________________________   LABS (all labs ordered are listed, but only abnormal results are displayed)  Labs Reviewed  BASIC METABOLIC PANEL - Abnormal; Notable for the following:       Result Value   Glucose, Bld 365 (*)    Calcium 8.5 (*)    All other components within normal limits  CBC WITH DIFFERENTIAL/PLATELET - Abnormal; Notable for the following:    HCT 38.3 (*)    All other components within normal limits  URIC ACID - Abnormal; Notable for the following:    Uric Acid, Serum 4.0 (*)    All other components within normal limits   ____________________________________________  EKG   ____________________________________________  RADIOLOGY Festus BarrenI, Jozelynn Danielson D Jacky Hartung, personally viewed and evaluated these images as part of my medical decision making, as well as reviewing the written report by the radiologist.  [place] Venous Img Lower Unilateral Right  Result Date: 10/19/2015 CLINICAL DATA:  Right leg swelling for 3 days. EXAM: RIGHT LOWER EXTREMITY VENOUS DOPPLER ULTRASOUND TECHNIQUE: Gray-scale sonography with graded compression, as well as color Doppler and duplex ultrasound were performed to evaluate the lower extremity deep venous systems from the level of the common femoral vein and including the common femoral, femoral, profunda femoral, popliteal and calf veins including the posterior tibial, peroneal and gastrocnemius veins when visible. The superficial great saphenous vein was also interrogated. Spectral Doppler was utilized to evaluate flow at rest and with distal augmentation maneuvers in the common femoral, femoral and popliteal veins. COMPARISON:  None. FINDINGS:  Contralateral Common Femoral Vein: Respiratory phasicity is normal and symmetric with the symptomatic side. No evidence of thrombus. Normal compressibility. Common Femoral Vein: No evidence of thrombus. Saphenofemoral Junction: No evidence of thrombus. Profunda Femoral Vein: No evidence of thrombus. Femoral Vein: No evidence of thrombus. Popliteal Vein: No evidence of thrombus. Calf Veins: No evidence of thrombus. IMPRESSION: No evidence of right lower extremity deep venous thrombosis. Electronically Signed   By: Marnee SpringJonathon  Watts M.D.   On: 10/19/2015 19:46    ____________________________________________    PROCEDURES  Procedure(s) performed:    Procedures    Medications - No data to display   ____________________________________________   INITIAL IMPRESSION / ASSESSMENT AND PLAN / ED COURSE  Pertinent labs & imaging results that were available during my care of the patient were reviewed by me and considered in my medical decision making (see chart for details).  Review of the [place] CSRS was performed in accordance of the [facility] prior to dispensing any controlled drugs.  Clinical Course    Patient's diagnosis is consistent with Mild Cellulitis to the right lower extremity. Patient's labs, ultrasound, exam are reassuring. No indication for DVT or gout on imaging or lab work. No systemic signs of infection. While area is warm and mildly erythematous, no firmness to palpation. No drainage. No  fluctuance.. Patient will be discharged home with prescriptions for antibiotics and NSAID for symptom control. Patient is to follow up with primary care provider as needed or otherwise directed. Patient is given ED precautions to return to the ED for any worsening or new symptoms.     ____________________________________________  FINAL CLINICAL IMPRESSION(S) / ED DIAGNOSES  Final diagnoses:  Cellulitis of right lower extremity      NEW MEDICATIONS STARTED DURING THIS VISIT:  New  Prescriptions   CEPHALEXIN (KEFLEX) 500 MG CAPSULE    Take 1 capsule (500 mg total) by mouth 4 (four) times daily.   MELOXICAM (MOBIC) 15 MG TABLET    Take 1 tablet (15 mg total) by mouth daily.        This chart was dictated using voice recognition software/Dragon. Despite best efforts to proofread, errors can occur which can change the meaning. Any change was purely unintentional.    Delorise RoyalsJonathan D Tanara Turvey, PA-C 10/19/15 [id number]    Gayla DossEryka A Gayle, MD 10/19/15 [phone]

## 2015-12-01 IMAGING — US US EXTREM LOW VENOUS*R*
1 series · 13 of 24 positions shown · non-contrast
Comparison: None.

CLINICAL DATA: Right leg swelling for 3 days.



[Series 1: us extrem low venous*right* · 0.07mm/px · 13 of 35 slices shown]
[im 1/35]
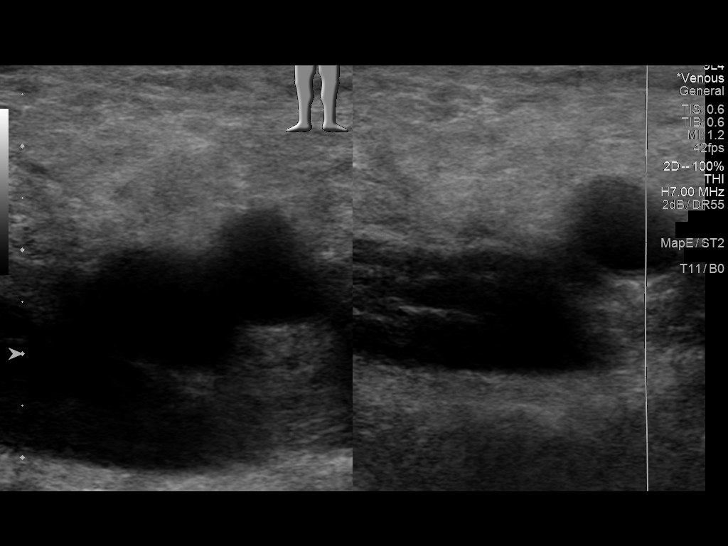
[im 3/35]
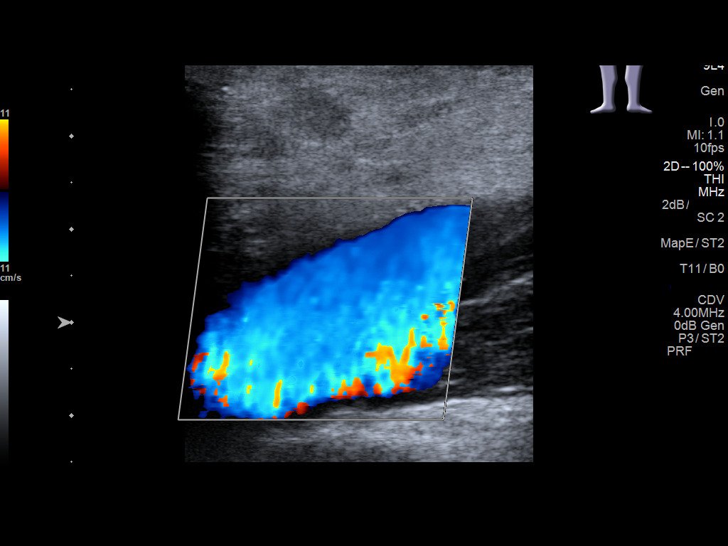
[im 6/35]
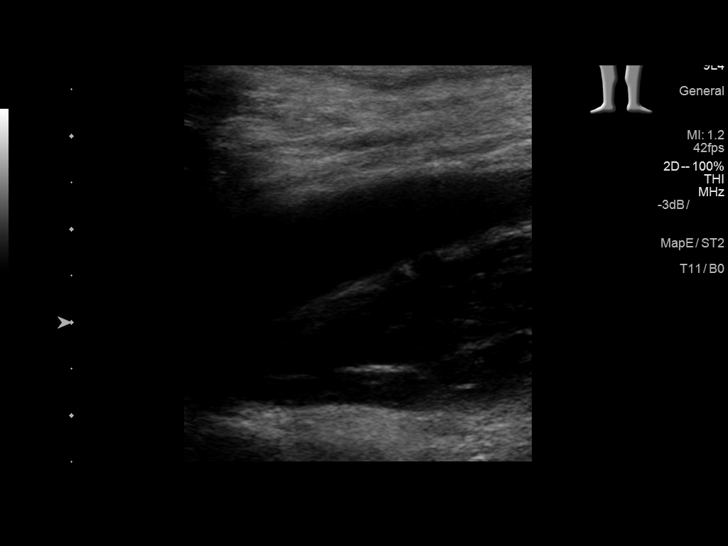
[im 9/35]
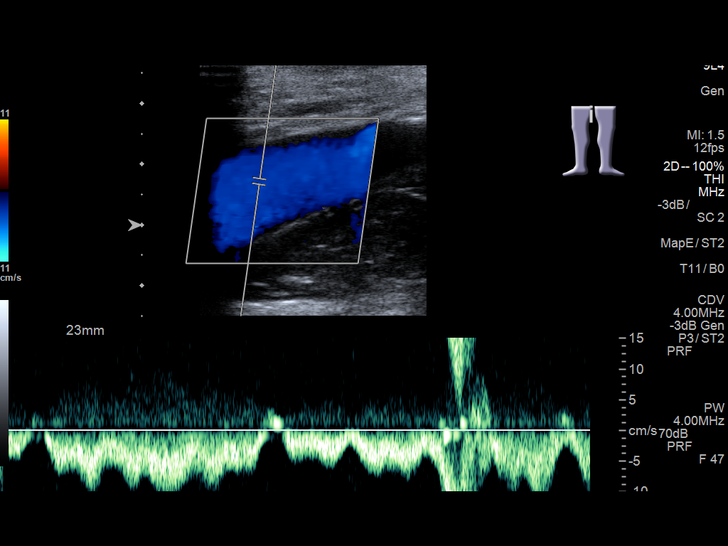
[im 12/35]
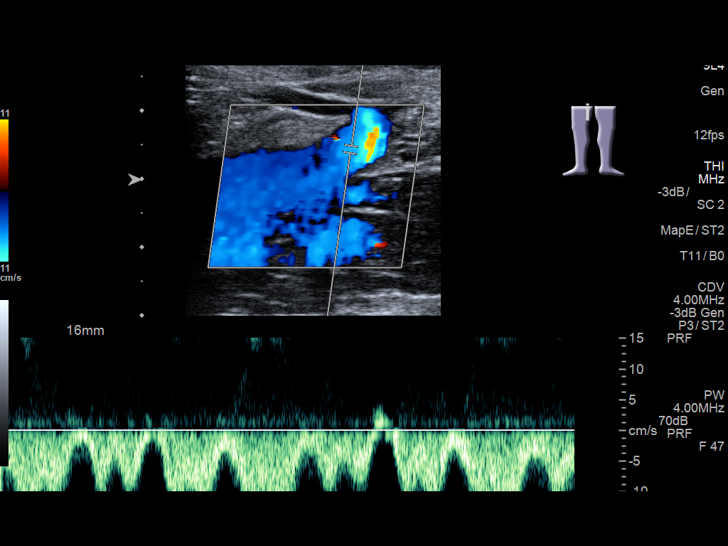
[im 15/35]
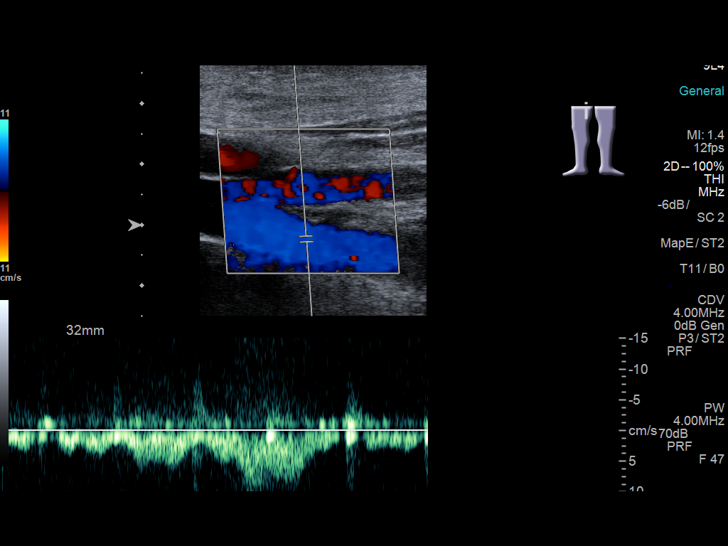
[im 18/35]
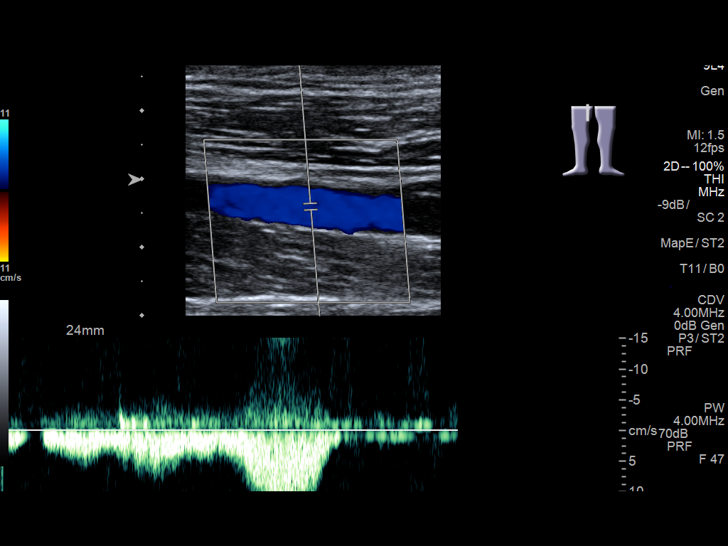
[im 20/35]
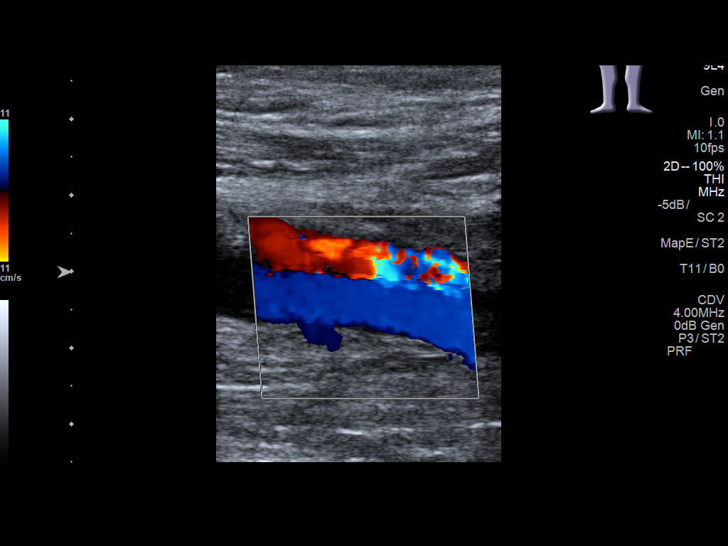
[im 23/35]
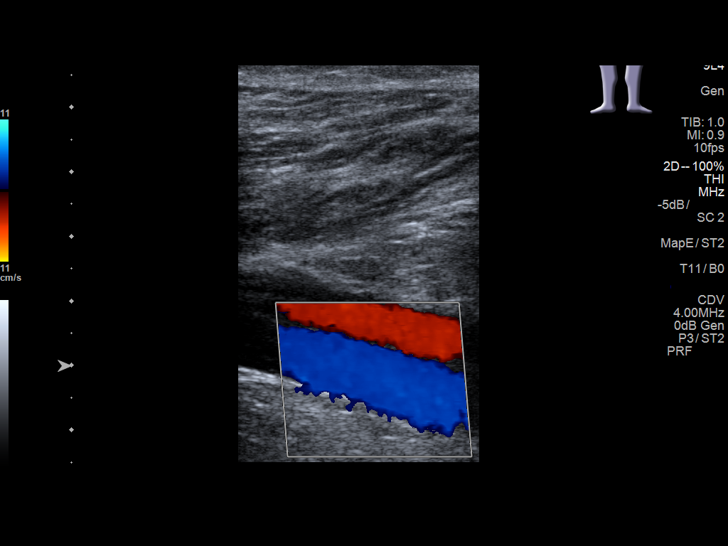
[im 26/35]
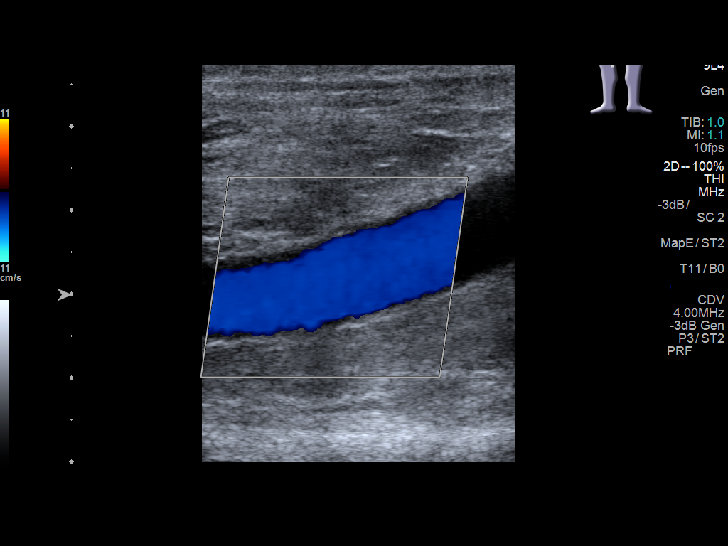
[im 29/35]
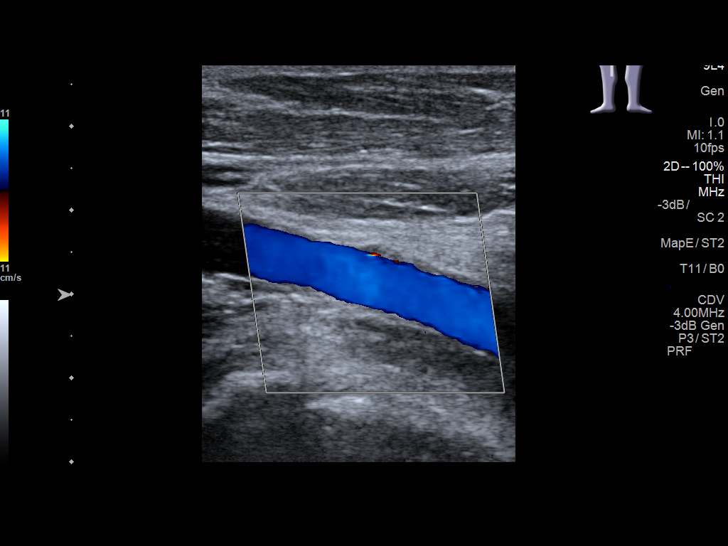
[im 32/35]
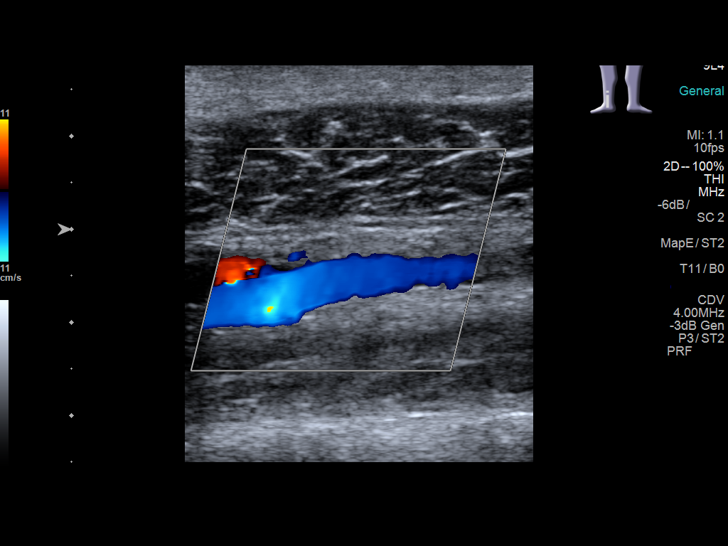
[im 35/35]
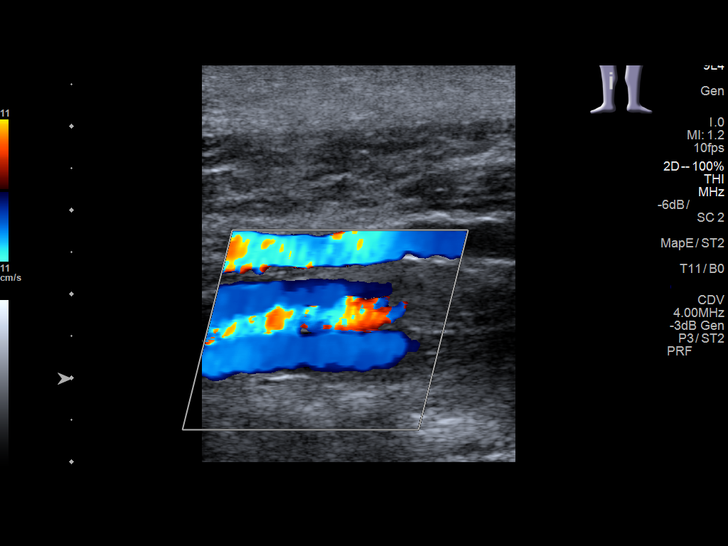

[13 of 24 positions shown; findings below may reference images not displayed]

FINDINGS: Contralateral Common Femoral Vein: Respiratory phasicity is normal
and symmetric with the symptomatic side. No evidence of thrombus.
Normal compressibility.

Common Femoral Vein: No evidence of thrombus.

Saphenofemoral Junction: No evidence of thrombus.

Profunda Femoral Vein: No evidence of thrombus.

Femoral Vein: No evidence of thrombus.

Popliteal Vein: No evidence of thrombus.

Calf Veins: No evidence of thrombus.
IMPRESSION: No evidence of right lower extremity deep venous thrombosis.

## 2017-04-15 ENCOUNTER — Other Ambulatory Visit: Payer: Self-pay

## 2017-04-15 ENCOUNTER — Encounter: Payer: Self-pay | Admitting: Emergency Medicine

## 2017-04-15 ENCOUNTER — Emergency Department
Admission: EM | Admit: 2017-04-15 | Discharge: 2017-04-15 | Disposition: A | Discharge status: Home / Self Care | Payer: Non-veteran care | Attending: Emergency Medicine | Admitting: Emergency Medicine

## 2017-04-15 ENCOUNTER — Emergency Department: Payer: Non-veteran care

## 2017-04-15 DIAGNOSIS — F17210 Nicotine dependence, cigarettes, uncomplicated: Secondary | ICD-10-CM | POA: Insufficient documentation

## 2017-04-15 DIAGNOSIS — Z79899 Other long term (current) drug therapy: Secondary | ICD-10-CM | POA: Insufficient documentation

## 2017-04-15 DIAGNOSIS — J181 Lobar pneumonia, unspecified organism: Secondary | ICD-10-CM | POA: Diagnosis not present

## 2017-04-15 DIAGNOSIS — E119 Type 2 diabetes mellitus without complications: Secondary | ICD-10-CM | POA: Insufficient documentation

## 2017-04-15 DIAGNOSIS — R05 Cough: Secondary | ICD-10-CM | POA: Diagnosis present

## 2017-04-15 DIAGNOSIS — J189 Pneumonia, unspecified organism: Secondary | ICD-10-CM

## 2017-04-15 LAB — INFLUENZA PANEL BY PCR (TYPE A & B)
INFLAPCR: NEGATIVE
INFLBPCR: NEGATIVE

## 2017-04-15 IMAGING — CR DG CHEST 2V
1 series · 3 of 3 positions shown · non-contrast
Comparison: Chest x-ray dated [DATE].

CLINICAL DATA: Smoker, cough and fever for 1 week.

EXAM:
CHEST  2 VIEW

[Series 1: dg chest 2 view · 0.14mm/px · 3 of 3 slices shown]
[im 1/3]
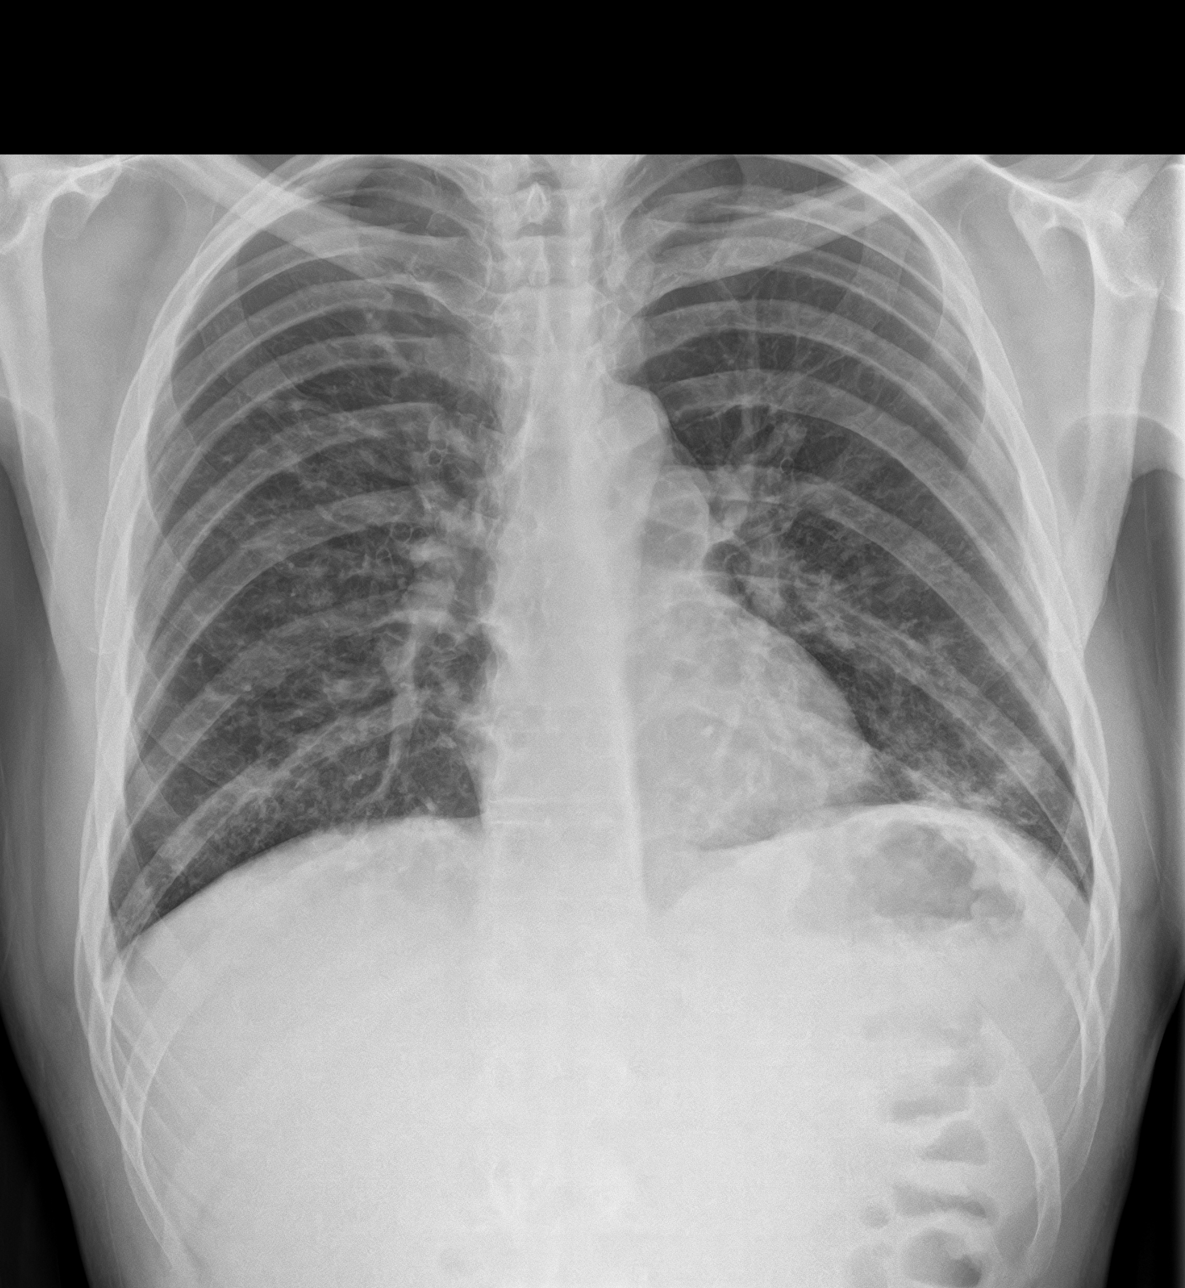
[im 2/3]
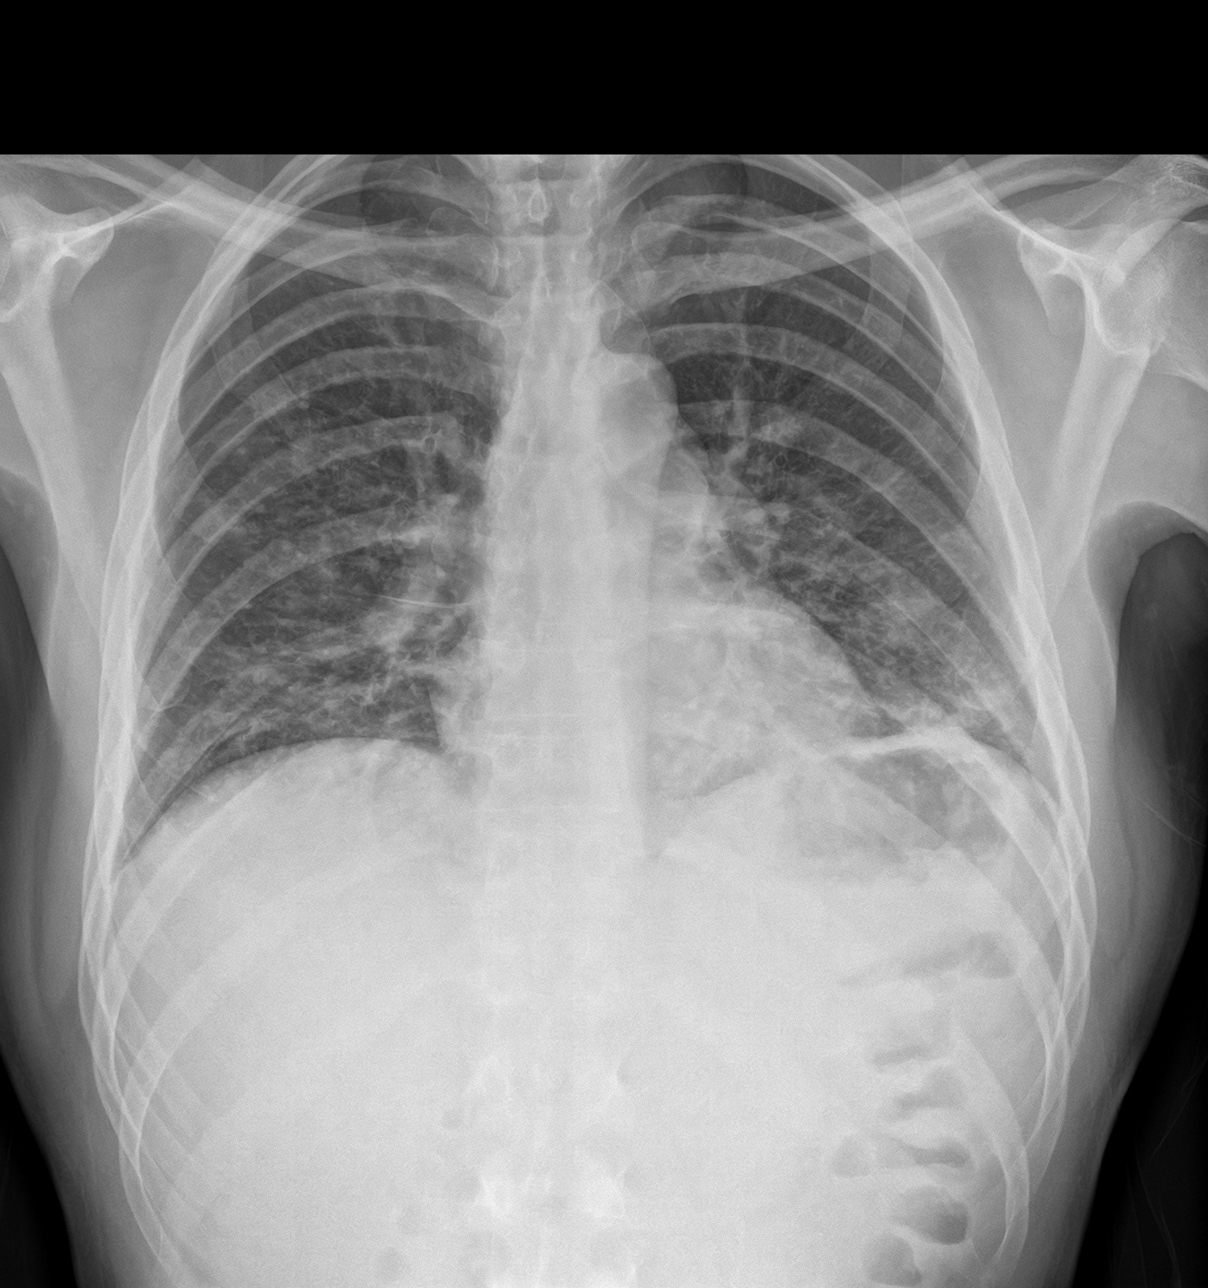
[im 3/3]
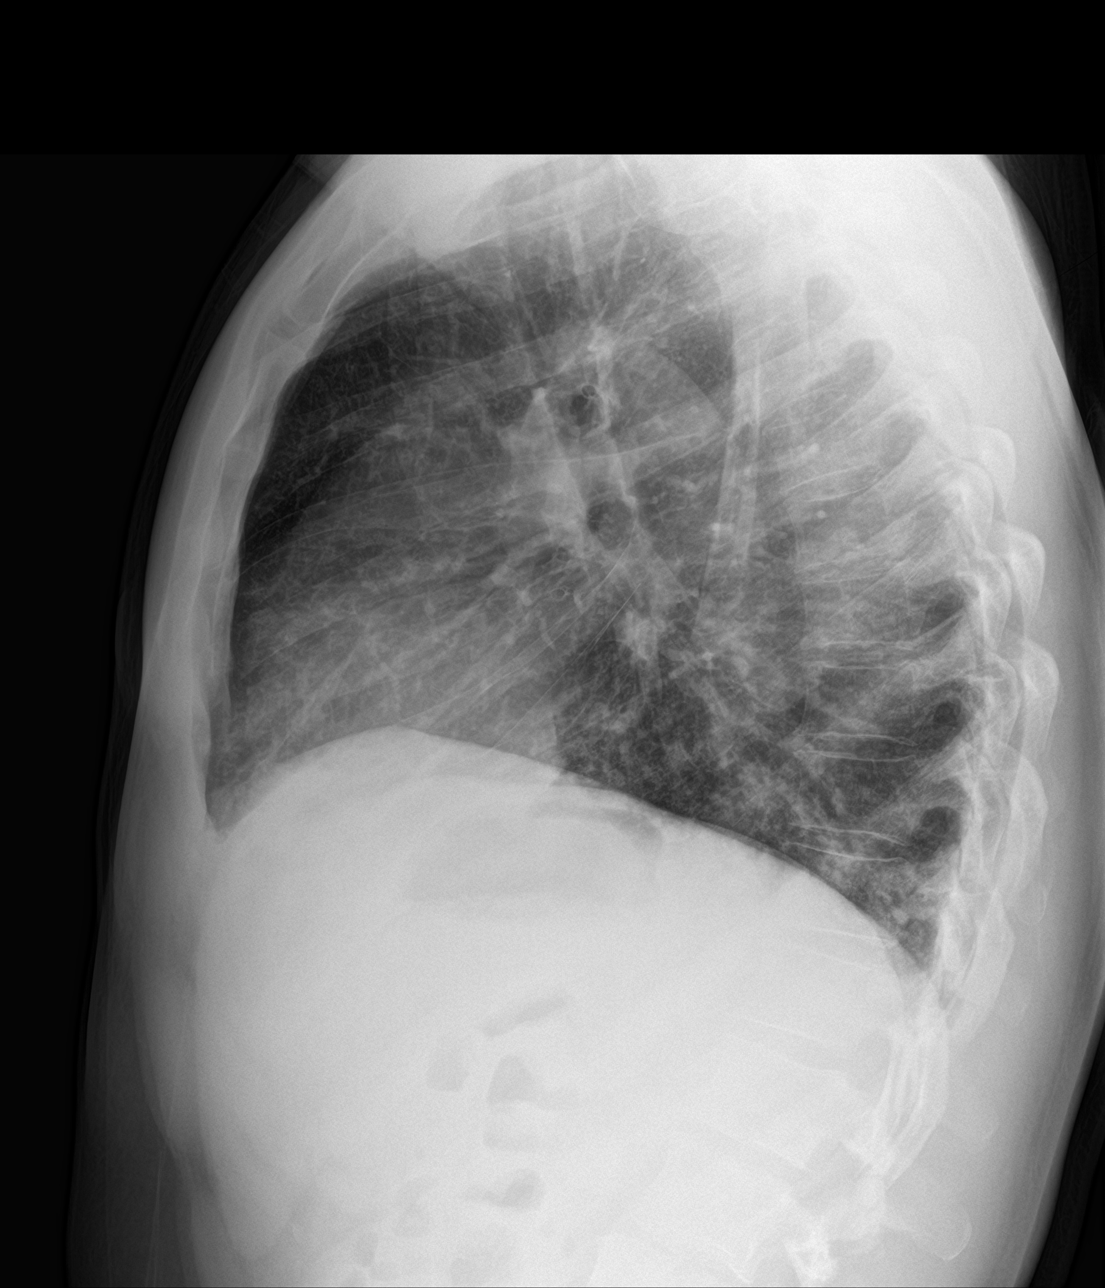

[3 of 3 positions shown; findings below may reference images not displayed]

FINDINGS: Patchy airspace opacities at the left lung base consistent with
pneumonia. Possible additional milder opacities at the right lung
base. Upper lungs are relatively clear. Heart size and mediastinal
contours are stable. Osseous structures about the chest are
unremarkable.
IMPRESSION: Left lower lobe pneumonia.  Questionable right lower lobe pneumonia.

## 2017-04-15 MED ORDER — LEVOFLOXACIN 500 MG PO TABS
500.0000 mg | ORAL_TABLET | Freq: Once | ORAL | Status: AC
  Administered 2017-04-15: 500 mg via ORAL
  Filled 2017-04-15: qty 1

## 2017-04-15 MED ORDER — BENZONATATE 200 MG PO CAPS: 200.0000 mg | ORAL_CAPSULE | Freq: Three times a day (TID) | ORAL | 0 refills | Status: DC | PRN

## 2017-04-15 MED ORDER — HYDROCODONE-HOMATROPINE 5-1.5 MG/5ML PO SYRP: 5.0000 mL | ORAL_SOLUTION | Freq: Four times a day (QID) | ORAL | 0 refills | Status: DC | PRN

## 2017-04-15 MED ORDER — IPRATROPIUM-ALBUTEROL 0.5-2.5 (3) MG/3ML IN SOLN
3.0000 mL | Freq: Once | RESPIRATORY_TRACT | Status: AC
  Administered 2017-04-15: 3 mL via RESPIRATORY_TRACT
  Filled 2017-04-15: qty 3

## 2017-04-15 MED ORDER — LEVOFLOXACIN 500 MG PO TABS: 500.0000 mg | ORAL_TABLET | Freq: Every day | ORAL | 0 refills | Status: DC

## 2017-04-15 NOTE — Discharge Instructions (Signed)
Follow-up with your regular doctor if you are not better in 3 days.  Use medication as prescribed.  I would prefer that you stay out of work until you are not running a fever.  You need to rest to heal.  Use the Tessalon Perles during the day if you are at work.  The Hycodan he can use at home.  Be careful as this as a narcotic in it and will make you drowsy.  Do not operate heavy machinery or drive a car while taking this medication

## 2017-04-15 NOTE — ED Provider Notes (Signed)
[facility] Emergency Department Provider Note  ____________________________________________   First MD Initiated Contact with Patient 04/15/17 1814     (approximate)  I have reviewed the triage vital signs and the nursing notes.   HISTORY  Chief Complaint Cough and Fever    HPI Donald Carpenter is a 42 y.o. male complains of a cough and fever for at least 1 week.  He states he feels a little short of breath.  He states at first he thought it was a cold but the cough just will not stop.  He denies chest pain, he denies vomiting or diarrhea, he denies sore throat congestion  Past Medical History:  Diagnosis Date  . Diabetes mellitus without complication (HCC)     There are no active problems to display for this patient.   History reviewed. No pertinent surgical history.  Prior to Admission medications   Medication Sig Start Date End Date Taking? Authorizing Provider  benzonatate (TESSALON) 200 MG capsule Take 1 capsule (200 mg total) by mouth 3 (three) times daily as needed for cough. 04/15/17   [place] Yepiz, Roselyn BeringSusan W, PA-C  cephALEXin (KEFLEX) 500 MG capsule Take 1 capsule (500 mg total) by mouth 4 (four) times daily. 10/19/15   Cuthriell, Delorise RoyalsJonathan D, PA-C  HYDROcodone-homatropine (HYCODAN) 5-1.5 MG/5ML syrup Take 5 mLs by mouth every 6 (six) hours as needed for cough. Or qhs 04/15/17   Vertis Bauder, Roselyn BeringSusan W, PA-C  levofloxacin (LEVAQUIN) 500 MG tablet Take 1 tablet (500 mg total) by mouth daily. 04/15/17   Ladasia Sircy, Roselyn BeringSusan W, PA-C  meloxicam (MOBIC) 15 MG tablet Take 1 tablet (15 mg total) by mouth daily. 10/19/15   Cuthriell, Delorise RoyalsJonathan D, PA-C    Allergies Patient has no known allergies.  No family history on file.  Social History Social History   Tobacco Use  . Smoking status: Current Every Day Smoker    Packs/day: 0.50    Types: Cigarettes  Substance Use Topics  . Alcohol use: Not on file  . Drug use: Not on file    Review of Systems  Constitutional:  Positive fever/chills Eyes: No visual changes. ENT: No sore throat. Respiratory: Positive cough Genitourinary: Negative for dysuria. Musculoskeletal: Negative for back pain. Skin: Negative for rash.    ____________________________________________   PHYSICAL EXAM:  VITAL SIGNS: ED Triage Vitals  Enc Vitals Group     BP 04/15/17 1702 (!) 162/96     Pulse Rate 04/15/17 1702 95     Resp 04/15/17 1702 20     Temp 04/15/17 1702 99 F (37.2 C)     Temp Source 04/15/17 1702 Oral     SpO2 04/15/17 1702 98 %     Weight 04/15/17 1704 180 lb (81.6 kg)     Height 04/15/17 1704 6\' 5"  (1.956 m)     Head Circumference --      Peak Flow --      Pain Score 04/15/17 1703 10     Pain Loc --      Pain Edu? --      Excl. in [place]? --     Constitutional: Alert and oriented. Well appearing and in no acute distress. Eyes: Conjunctivae are normal.  Head: Atraumatic. Nose: Positive for active congestion/rhinnorhea. Mouth/Throat: Mucous membranes are moist.  Throat is normal Cardiovascular: Normal rate, regular rhythm.  Heart sounds are normal Respiratory: Normal respiratory effort.  No retractions, lungs with rhonchi in the lower lung fields bilaterally GU: deferred Musculoskeletal: FROM all extremities, warm and well  perfused Neurologic:  Normal speech and language.  Skin:  Skin is warm, dry and intact. No rash noted. Psychiatric: Mood and affect are normal. Speech and behavior are normal.  ____________________________________________   LABS (all labs ordered are listed, but only abnormal results are displayed)  Labs Reviewed  INFLUENZA PANEL BY PCR (TYPE A & B)   ____________________________________________   ____________________________________________  RADIOLOGY  Chest x-ray is positive for right lower lobe pneumonia and questionable lower lobe pneumonia  ____________________________________________   PROCEDURES  Procedure(s) performed:  DuoNeb  Procedures    ____________________________________________   INITIAL IMPRESSION / ASSESSMENT AND PLAN / ED COURSE  Pertinent labs & imaging results that were available during my care of the patient were reviewed by me and considered in my medical decision making (see chart for details).  Patient is 42 year old male presents emergency department complaining of cough and fever for a week.  On physical exam the patient has a wet cough.  There is some wheezing/rhonchi in the lower lung fields bilaterally.  Flu test is negative.  Chest x-ray shows a right lower lobe pneumonia and questionable left lower lobe pneumonia  Levaquin 500 mg p.o. was given in the emergency department    ----------------------------------------- 7:08 PM on 04/15/2017 -----------------------------------------  Patient has increased air movement after the DuoNeb.  He still has a congested cough.  Chest x-ray results and influenza results were given to the patient.  He was given a prescription for Levaquin 500 mg daily for 10 days.  Tessalon Perles 200 mg 3 times daily as needed for cough.  Hycodan cough syrup for cough that is not suppressed by Jerilynn Som.  Patient was instructed to use this only as needed as it does have a narcotic in it.  He is not to operate heavy machinery or drive a car while taking this medication.  Patient states he understands will comply with those instructions.  He was given a work note and is not to return to work until he is not had a fever for 24-48 hours.  Explained to him that he needed to rest.  If he is worsening he is to return to the emergency department.  Patient states he understands and he was discharged in stable condition  As part of my medical decision making, I reviewed the following data within the electronic MEDICAL RECORD NUMBER Nursing notes reviewed and incorporated, Labs reviewed influenza test is negative, Radiograph reviewed chest x-ray is positive for left lower  lobe pneumonia, Notes from prior ED visits and [place] Controlled Substance Database  ____________________________________________   FINAL CLINICAL IMPRESSION(S) / ED DIAGNOSES  Final diagnoses:  Community acquired pneumonia of left lower lobe of lung (HCC)      NEW MEDICATIONS STARTED DURING THIS VISIT:  New Prescriptions   BENZONATATE (TESSALON) 200 MG CAPSULE    Take 1 capsule (200 mg total) by mouth 3 (three) times daily as needed for cough.   HYDROCODONE-HOMATROPINE (HYCODAN) 5-1.5 MG/5ML SYRUP    Take 5 mLs by mouth every 6 (six) hours as needed for cough. Or qhs   LEVOFLOXACIN (LEVAQUIN) 500 MG TABLET    Take 1 tablet (500 mg total) by mouth daily.     Note:  This document was prepared using Dragon voice recognition software and may include unintentional dictation errors.    Faythe Ghee, PA-C 04/15/17 1910    Sharyn Creamer, MD 04/15/17 2137

## 2017-04-15 NOTE — ED Notes (Signed)

## 2017-04-15 NOTE — ED Triage Notes (Signed)
Cough and fever x 1 week.  

## 2018-10-28 ENCOUNTER — Emergency Department
Admission: EM | Admit: 2018-10-28 | Discharge: 2018-10-29 | Disposition: A | Discharge status: Home / Self Care | Payer: No Typology Code available for payment source | Attending: Emergency Medicine | Admitting: Emergency Medicine

## 2018-10-28 ENCOUNTER — Other Ambulatory Visit: Payer: Self-pay

## 2018-10-28 DIAGNOSIS — F17210 Nicotine dependence, cigarettes, uncomplicated: Secondary | ICD-10-CM | POA: Diagnosis not present

## 2018-10-28 DIAGNOSIS — E119 Type 2 diabetes mellitus without complications: Secondary | ICD-10-CM | POA: Insufficient documentation

## 2018-10-28 DIAGNOSIS — H60392 Other infective otitis externa, left ear: Secondary | ICD-10-CM | POA: Diagnosis not present

## 2018-10-28 DIAGNOSIS — Z79899 Other long term (current) drug therapy: Secondary | ICD-10-CM | POA: Diagnosis not present

## 2018-10-28 DIAGNOSIS — H9202 Otalgia, left ear: Secondary | ICD-10-CM | POA: Diagnosis present

## 2018-10-28 MED ORDER — NEOMYCIN-COLIST-HC-THONZONIUM 3.3-3-10-0.5 MG/ML OT SUSP: 4.0000 [drp] | Freq: Four times a day (QID) | OTIC | Status: DC

## 2018-10-28 MED ORDER — NEOMYCIN-POLYMYXIN-HC 3.5-10000-1 OT SUSP
4.0000 [drp] | Freq: Four times a day (QID) | OTIC | Status: DC
  Administered 2018-10-29: 4 [drp] via OTIC
  Filled 2018-10-28: qty 10

## 2018-10-28 MED ORDER — NEOMYCIN-POLYMYXIN-HC 3.5-10000-1 OT SOLN
4.0000 [drp] | Freq: Four times a day (QID) | OTIC | Status: DC
  Filled 2018-10-28: qty 10

## 2018-10-28 NOTE — ED Triage Notes (Signed)
Patient reports left ear pain for 3 days. 

## 2018-10-28 NOTE — ED Provider Notes (Signed)
[facility] Emergency Department Provider Note  ____________________________________________  Time seen: Approximately 11:10 PM  I have reviewed the triage vital signs and the nursing notes.   HISTORY  Chief Complaint Otalgia    HPI Donald Carpenter is a 43 y.o. male who presents the emergency department complaining of left ear pain, drainage of the left ear.  Patient has noticed symptoms x2 days.  Patient initially thought he had water in his ear from a shower but pain has increased and now he is having purulent drainage.  No hearing changes.  No fevers or chills.  No history of similar complaint in the past.  Patient has not inserted anything into his ear.  No recent swimming.         Past Medical History:  Diagnosis Date  . Diabetes mellitus without complication ([place])     There are no active problems to display for this patient.   No past surgical history on file.  Prior to Admission medications   Medication Sig Start Date End Date Taking? Authorizing Provider  benzonatate (TESSALON) 200 MG capsule Take 1 capsule (200 mg total) by mouth 3 (three) times daily as needed for cough. 04/15/17   Fisher, Linden Dolin, PA-C  cephALEXin (KEFLEX) 500 MG capsule Take 1 capsule (500 mg total) by mouth 4 (four) times daily. 10/19/15   Cuthriell, Charline Bills, PA-C  HYDROcodone-homatropine (HYCODAN) 5-1.5 MG/5ML syrup Take 5 mLs by mouth every 6 (six) hours as needed for cough. Or qhs 04/15/17   Fisher, Linden Dolin, PA-C  levofloxacin (LEVAQUIN) 500 MG tablet Take 1 tablet (500 mg total) by mouth daily. 04/15/17   Fisher, Linden Dolin, PA-C  meloxicam (MOBIC) 15 MG tablet Take 1 tablet (15 mg total) by mouth daily. 10/19/15   Cuthriell, Charline Bills, PA-C  neomycin-polymyxin-hydrocortisone (CORTISPORIN) OTIC solution Place 3 drops into the left ear 3 (three) times daily for 10 days. 10/29/18 11/08/18  Cuthriell, Charline Bills, PA-C    Allergies Patient has no known allergies.  No family  history on file.  Social History Social History   Tobacco Use  . Smoking status: Current Every Day Smoker    Packs/day: 0.50    Types: Cigarettes  Substance Use Topics  . Alcohol use: Not on file  . Drug use: Not on file     Review of Systems  Constitutional: No fever/chills Eyes: No visual changes. No discharge ENT: Positive for left ear pain and drainage from the left ear Cardiovascular: no chest pain. Respiratory: no cough. No SOB. Gastrointestinal: No abdominal pain.  No nausea, no vomiting.  No diarrhea.  No constipation. Musculoskeletal: Negative for musculoskeletal pain. Skin: Negative for rash, abrasions, lacerations, ecchymosis. Neurological: Negative for headaches, focal weakness or numbness. 10-point ROS otherwise negative.  ____________________________________________   PHYSICAL EXAM:  VITAL SIGNS: ED Triage Vitals [10/28/18 2251]  Enc Vitals Group     BP      Pulse      Resp      Temp      Temp src      SpO2      Weight 179 lb (81.2 kg)     Height 6' 5.5" (1.969 m)     Head Circumference      Peak Flow      Pain Score 7     Pain Loc      Pain Edu?      Excl. in [place]?      Constitutional: Alert and oriented. Well appearing and in  no acute distress. Eyes: Conjunctivae are normal. PERRL. EOMI. Head: Atraumatic. ENT:      Ears: EAC and TM on right is unremarkable.  EAC on left is diffusely edematous with drainage and erythema.  TM is visualized with no signs of bulging or erythema.      Nose: No congestion/rhinnorhea.      Mouth/Throat: Mucous membranes are moist.  Neck: No stridor.   Hematological/Lymphatic/Immunilogical: No cervical lymphadenopathy. Cardiovascular: Normal rate, regular rhythm. Normal S1 and S2.  Good peripheral circulation. Respiratory: Normal respiratory effort without tachypnea or retractions. Lungs CTAB. Good air entry to the bases with no decreased or absent breath sounds. Musculoskeletal: Full range of motion to all  extremities. No gross deformities appreciated. Neurologic:  Normal speech and language. No gross focal neurologic deficits are appreciated.  Skin:  Skin is warm, dry and intact. No rash noted. Psychiatric: Mood and affect are normal. Speech and behavior are normal. Patient exhibits appropriate insight and judgement.   ____________________________________________   LABS (all labs ordered are listed, but only abnormal results are displayed)  Labs Reviewed - No data to display ____________________________________________  EKG   ____________________________________________  RADIOLOGY   No results found.  ____________________________________________    PROCEDURES  Procedure(s) performed:    Procedures    Medications  neomycin-polymyxin-hydrocortisone (CORTISPORIN) OTIC (EAR) suspension 4 drop (has no administration in time range)     ____________________________________________   INITIAL IMPRESSION / ASSESSMENT AND PLAN / ED COURSE  Pertinent labs & imaging results that were available during my care of the patient were reviewed by me and considered in my medical decision making (see chart for details).  Review of the [place] CSRS was performed in accordance of the [facility] prior to dispensing any controlled drugs.           Patient's diagnosis is consistent with otitis externa.  Patient presented to the emergency department with left ear pain and drainage.  Findings are consistent with otitis externa, differential included foreign body, otitis media, otitis externa.  Patient is started on antibiotic eardrops.. Patient will be discharged home with prescriptions for Cortisporin. Patient is to follow up with primary care as needed or otherwise directed. Patient is given ED precautions to return to the ED for any worsening or new symptoms.     ____________________________________________  FINAL CLINICAL IMPRESSION(S) / ED DIAGNOSES  Final diagnoses:  Other infective  acute otitis externa of left ear      NEW MEDICATIONS STARTED DURING THIS VISIT:  ED Discharge Orders         Ordered    neomycin-polymyxin-hydrocortisone (CORTISPORIN) OTIC solution  3 times daily     10/29/18 0008              This chart was dictated using voice recognition software/Dragon. Despite best efforts to proofread, errors can occur which can change the meaning. Any change was purely unintentional.    Racheal PatchesCuthriell, Jonathan D, PA-C 10/29/18 0012    Concha SeFunke, Mary E, MD 10/30/18 [phone]

## 2018-10-29 MED ORDER — NEOMYCIN-POLYMYXIN-HC 3.5-10000-1 OT SOLN: 3.0000 [drp] | Freq: Three times a day (TID) | OTIC | 0 refills | Status: AC

## 2018-10-31 ENCOUNTER — Other Ambulatory Visit: Payer: Self-pay

## 2018-10-31 ENCOUNTER — Emergency Department
Admission: EM | Admit: 2018-10-31 | Discharge: 2018-10-31 | Disposition: A | Discharge status: Home / Self Care | Payer: No Typology Code available for payment source | Attending: Emergency Medicine | Admitting: Emergency Medicine

## 2018-10-31 DIAGNOSIS — H60312 Diffuse otitis externa, left ear: Secondary | ICD-10-CM | POA: Diagnosis not present

## 2018-10-31 DIAGNOSIS — Z79899 Other long term (current) drug therapy: Secondary | ICD-10-CM | POA: Insufficient documentation

## 2018-10-31 DIAGNOSIS — H9202 Otalgia, left ear: Secondary | ICD-10-CM | POA: Diagnosis present

## 2018-10-31 DIAGNOSIS — F17210 Nicotine dependence, cigarettes, uncomplicated: Secondary | ICD-10-CM | POA: Insufficient documentation

## 2018-10-31 MED ORDER — AMOXICILLIN-POT CLAVULANATE 875-125 MG PO TABS: 1.0000 | ORAL_TABLET | Freq: Two times a day (BID) | ORAL | 0 refills | Status: DC

## 2018-10-31 MED ORDER — AMOXICILLIN-POT CLAVULANATE 875-125 MG PO TABS
1.0000 | ORAL_TABLET | Freq: Once | ORAL | Status: AC
  Administered 2018-10-31: 1 via ORAL
  Filled 2018-10-31: qty 1

## 2018-10-31 MED ORDER — HYDROCODONE-ACETAMINOPHEN 5-325 MG PO TABS
1.0000 | ORAL_TABLET | Freq: Once | ORAL | Status: AC
  Administered 2018-10-31: 1 via ORAL
  Filled 2018-10-31: qty 1

## 2018-10-31 NOTE — ED Triage Notes (Signed)
Pt seen here on Sunday. Dx with swimmers ear and put on ear drops. Pt now has pain to the left ear that is worse and hurts with talking, eating. Pain moving down into the neck.

## 2018-10-31 NOTE — ED Provider Notes (Signed)
[facility] Emergency Department Provider Note ____________________________________________  Time seen: Approximately 10:48 PM  I have reviewed the triage vital signs and the nursing notes.   HISTORY  Chief Complaint Otalgia    HPI Donald Carpenter is a 43 y.o. male who presents to the emergency department for treatment and evaluation of left otalgia.  He was evaluated here few days ago and placed on drops for an otitis externa.  Symptoms have progressively worsened over the past 24 hours.  He has taken some Aleve with little relief.   Past Medical History:  Diagnosis Date  . Diabetes mellitus without complication (HCC)     There are no active problems to display for this patient.   History reviewed. No pertinent surgical history.  Prior to Admission medications   Medication Sig Start Date End Date Taking? Authorizing Provider  amoxicillin-clavulanate (AUGMENTIN) 875-125 MG tablet Take 1 tablet by mouth 2 (two) times daily. 10/31/18   Riana Tessmer B, FNP  benzonatate (TESSALON) 200 MG capsule Take 1 capsule (200 mg total) by mouth 3 (three) times daily as needed for cough. 04/15/17   Fisher, Roselyn BeringSusan W, PA-C  cephALEXin (KEFLEX) 500 MG capsule Take 1 capsule (500 mg total) by mouth 4 (four) times daily. 10/19/15   Cuthriell, Delorise RoyalsJonathan D, PA-C  HYDROcodone-homatropine (HYCODAN) 5-1.5 MG/5ML syrup Take 5 mLs by mouth every 6 (six) hours as needed for cough. Or qhs 04/15/17   Fisher, Roselyn BeringSusan W, PA-C  levofloxacin (LEVAQUIN) 500 MG tablet Take 1 tablet (500 mg total) by mouth daily. 04/15/17   Fisher, Roselyn BeringSusan W, PA-C  meloxicam (MOBIC) 15 MG tablet Take 1 tablet (15 mg total) by mouth daily. 10/19/15   Cuthriell, Delorise RoyalsJonathan D, PA-C  neomycin-polymyxin-hydrocortisone (CORTISPORIN) OTIC solution Place 3 drops into the left ear 3 (three) times daily for 10 days. 10/29/18 11/08/18  Cuthriell, Delorise RoyalsJonathan D, PA-C    Allergies Patient has no known allergies.  No family history on  file.  Social History Social History   Tobacco Use  . Smoking status: Current Every Day Smoker    Packs/day: 0.50    Types: Cigarettes  Substance Use Topics  . Alcohol use: Not Currently  . Drug use: Not Currently    Review of Systems Constitutional: Negative for fever.  Positive for decreased ability to hear from left ear(s). Eyes: Negative for discharge or drainage. ENT:       Positive for otalgia in left ear(s).      Negative for rhinorrhea or congestion.      Negative for sore throat. Gastrointestinal: Negative for nausea, vomiting, or diarrhea. Musculoskeletal: Negative for myalgias. Skin: Negative for rash, lesions, or wounds. Neurological: Negative for paresthesias. ____________________________________________   PHYSICAL EXAM:  VITAL SIGNS: ED Triage Vitals  Enc Vitals Group     BP 10/31/18 2236 140/85     Pulse Rate 10/31/18 2236 95     Resp 10/31/18 2236 18     Temp 10/31/18 2236 98.7 F (37.1 C)     Temp Source 10/31/18 2236 Oral     SpO2 10/31/18 2236 100 %     Weight 10/31/18 2237 177 lb (80.3 kg)     Height 10/31/18 2237 6' 5.5" (1.969 m)     Head Circumference --      Peak Flow --      Pain Score 10/31/18 2237 9     Pain Loc --      Pain Edu? --      Excl. in GC? --  Constitutional: Well appearing. Eyes: Conjunctivae are clear without discharge or drainage. Ears:       Right TM: Normal.      Left TM: EAC swollen with purulent drainage. Head: Atraumatic. Nose: No rhinorrhea or sinus pain on percussion. Mouth/Throat: Oropharynx normal. Tonsils normal without exudate. Hematological/Lymphatic/Immunilogical: No palpable anterior cervical lymphadenopathy. Cardiovascular: Heart rate and rhythm are regular without murmur, gallop, or rub appreciated. Respiratory: Breath sounds are clear throughout to auscultation.  Neurologic:  Alert and oriented x 4. Skin: Intact and without rash, lesion, or wound on exposed skin  surfaces. ____________________________________________   LABS (all labs ordered are listed, but only abnormal results are displayed)  Labs Reviewed - No data to display ____________________________________________   RADIOLOGY  Not indicated ____________________________________________   PROCEDURES  Procedure(s) performed:   Procedures  ____________________________________________   INITIAL IMPRESSION / ASSESSMENT AND PLAN / ED COURSE  43 year old male presenting to the emergency department for treatment and evaluation of left otalgia.  EAC was completely swollen shut.  Donald Carpenter was placed and saline drops inserted.  Patient was advised that he needs to continue using the drops that were prescribed at his last visit.  He will also be placed on Augmentin.  First doses were given tonight as well as a single dose of Norco.  He was advised to follow-up with ENT if her symptoms are not improving over the next few days.  He was advised to return to the emergency department for symptoms of change or worsen if he is unable to schedule an appointment.  Pertinent labs & imaging results that were available during my care of the patient were reviewed by me and considered in my medical decision making (see chart for details). ____________________________________________   FINAL CLINICAL IMPRESSION(S) / ED DIAGNOSES  Final diagnoses:  Acute diffuse otitis externa of left ear    ED Discharge Orders         Ordered    amoxicillin-clavulanate (AUGMENTIN) 875-125 MG tablet  2 times daily     10/31/18 2303          If controlled substance prescribed during this visit, 12 month history viewed on the [place] prior to issuing an initial prescription for Schedule II or III opiod.   Note:  This document was prepared using Dragon voice recognition software and may include unintentional dictation errors.    Victorino Dike, FNP 10/31/18 0277    Harvest Dark, MD 11/01/18 2219

## 2020-06-21 ENCOUNTER — Observation Stay
Admission: EM | Admit: 2020-06-21 | Discharge: 2020-06-22 | Disposition: A | Discharge status: Home / Self Care | Payer: No Typology Code available for payment source | Attending: Internal Medicine | Admitting: Internal Medicine

## 2020-06-21 ENCOUNTER — Emergency Department: Payer: No Typology Code available for payment source

## 2020-06-21 ENCOUNTER — Observation Stay: Payer: No Typology Code available for payment source

## 2020-06-21 ENCOUNTER — Encounter: Payer: Self-pay | Admitting: Intensive Care

## 2020-06-21 ENCOUNTER — Other Ambulatory Visit: Payer: Self-pay

## 2020-06-21 DIAGNOSIS — R000 Tachycardia, unspecified: Secondary | ICD-10-CM

## 2020-06-21 DIAGNOSIS — M7989 Other specified soft tissue disorders: Secondary | ICD-10-CM

## 2020-06-21 DIAGNOSIS — R0789 Other chest pain: Secondary | ICD-10-CM

## 2020-06-21 DIAGNOSIS — E11621 Type 2 diabetes mellitus with foot ulcer: Principal | ICD-10-CM | POA: Insufficient documentation

## 2020-06-21 DIAGNOSIS — R739 Hyperglycemia, unspecified: Secondary | ICD-10-CM | POA: Diagnosis not present

## 2020-06-21 DIAGNOSIS — E1165 Type 2 diabetes mellitus with hyperglycemia: Secondary | ICD-10-CM | POA: Diagnosis not present

## 2020-06-21 DIAGNOSIS — E870 Hyperosmolality and hypernatremia: Secondary | ICD-10-CM | POA: Diagnosis not present

## 2020-06-21 DIAGNOSIS — F17210 Nicotine dependence, cigarettes, uncomplicated: Secondary | ICD-10-CM | POA: Diagnosis not present

## 2020-06-21 DIAGNOSIS — Z20822 Contact with and (suspected) exposure to covid-19: Secondary | ICD-10-CM | POA: Diagnosis not present

## 2020-06-21 DIAGNOSIS — E871 Hypo-osmolality and hyponatremia: Secondary | ICD-10-CM | POA: Diagnosis not present

## 2020-06-21 DIAGNOSIS — L089 Local infection of the skin and subcutaneous tissue, unspecified: Secondary | ICD-10-CM | POA: Diagnosis not present

## 2020-06-21 DIAGNOSIS — L97519 Non-pressure chronic ulcer of other part of right foot with unspecified severity: Secondary | ICD-10-CM | POA: Diagnosis not present

## 2020-06-21 DIAGNOSIS — E11628 Type 2 diabetes mellitus with other skin complications: Secondary | ICD-10-CM

## 2020-06-21 DIAGNOSIS — R2241 Localized swelling, mass and lump, right lower limb: Secondary | ICD-10-CM | POA: Diagnosis present

## 2020-06-21 LAB — CBC
HCT: 35.4 % — ABNORMAL LOW (ref 39.0–52.0)
Hemoglobin: 11.8 g/dL — ABNORMAL LOW (ref 13.0–17.0)
MCH: 27.3 pg (ref 26.0–34.0)
MCHC: 33.3 g/dL (ref 30.0–36.0)
MCV: 81.8 fL (ref 80.0–100.0)
Platelets: 175 10*3/uL (ref 150–400)
RBC: 4.33 MIL/uL (ref 4.22–5.81)
RDW: 12.8 % (ref 11.5–15.5)
WBC: 8.6 10*3/uL (ref 4.0–10.5)
nRBC: 0 % (ref 0.0–0.2)

## 2020-06-21 LAB — BASIC METABOLIC PANEL
Anion gap: 10 (ref 5–15)
BUN: 18 mg/dL (ref 6–20)
CO2: 26 mmol/L (ref 22–32)
Calcium: 8.9 mg/dL (ref 8.9–10.3)
Chloride: 94 mmol/L — ABNORMAL LOW (ref 98–111)
Creatinine, Ser: 1.12 mg/dL (ref 0.61–1.24)
GFR, Estimated: >60 mL/min (ref >60)
Glucose, Bld: 404 mg/dL — ABNORMAL HIGH (ref 70–99)
Potassium: 4 mmol/L (ref 3.5–5.1)
Sodium: 130 mmol/L — ABNORMAL LOW (ref 135–145)

## 2020-06-21 LAB — TROPONIN I (HIGH SENSITIVITY)
Troponin I (High Sensitivity): 13 ng/L (ref <18)
Troponin I (High Sensitivity): 13 ng/L (ref <18)

## 2020-06-21 LAB — GLUCOSE, CAPILLARY
Glucose-Capillary: 259 mg/dL — ABNORMAL HIGH (ref 70–99)
Glucose-Capillary: 308 mg/dL — ABNORMAL HIGH (ref 70–99)

## 2020-06-21 LAB — LACTIC ACID, PLASMA
Lactic Acid, Venous: 1.1 mmol/L (ref 0.5–1.9)
Lactic Acid, Venous: 0.8 mmol/L (ref 0.5–1.9)

## 2020-06-21 LAB — HEMOGLOBIN A1C
Hgb A1c MFr Bld: 14.7 % — ABNORMAL HIGH (ref 4.8–5.6)
Mean Plasma Glucose: 375.19 mg/dL

## 2020-06-21 LAB — RESP PANEL BY RT-PCR (FLU A&B, COVID) ARPGX2
Influenza A by PCR: NEGATIVE
Influenza B by PCR: NEGATIVE
SARS Coronavirus 2 by RT PCR: NEGATIVE

## 2020-06-21 LAB — SEDIMENTATION RATE: Sed Rate: 37 mm/hr — ABNORMAL HIGH (ref 0–15)

## 2020-06-21 LAB — C-REACTIVE PROTEIN: CRP: 6.3 mg/dL — ABNORMAL HIGH (ref <1.0)

## 2020-06-21 LAB — HIV ANTIBODY (ROUTINE TESTING W REFLEX): HIV Screen 4th Generation wRfx: NONREACTIVE

## 2020-06-21 IMAGING — CR DG CHEST 2V
2 series · 2 of 2 positions shown · non-contrast
Comparison: [DATE]

CLINICAL DATA: Chest pain

EXAM:
CHEST - 2 VIEW

[chest pa]
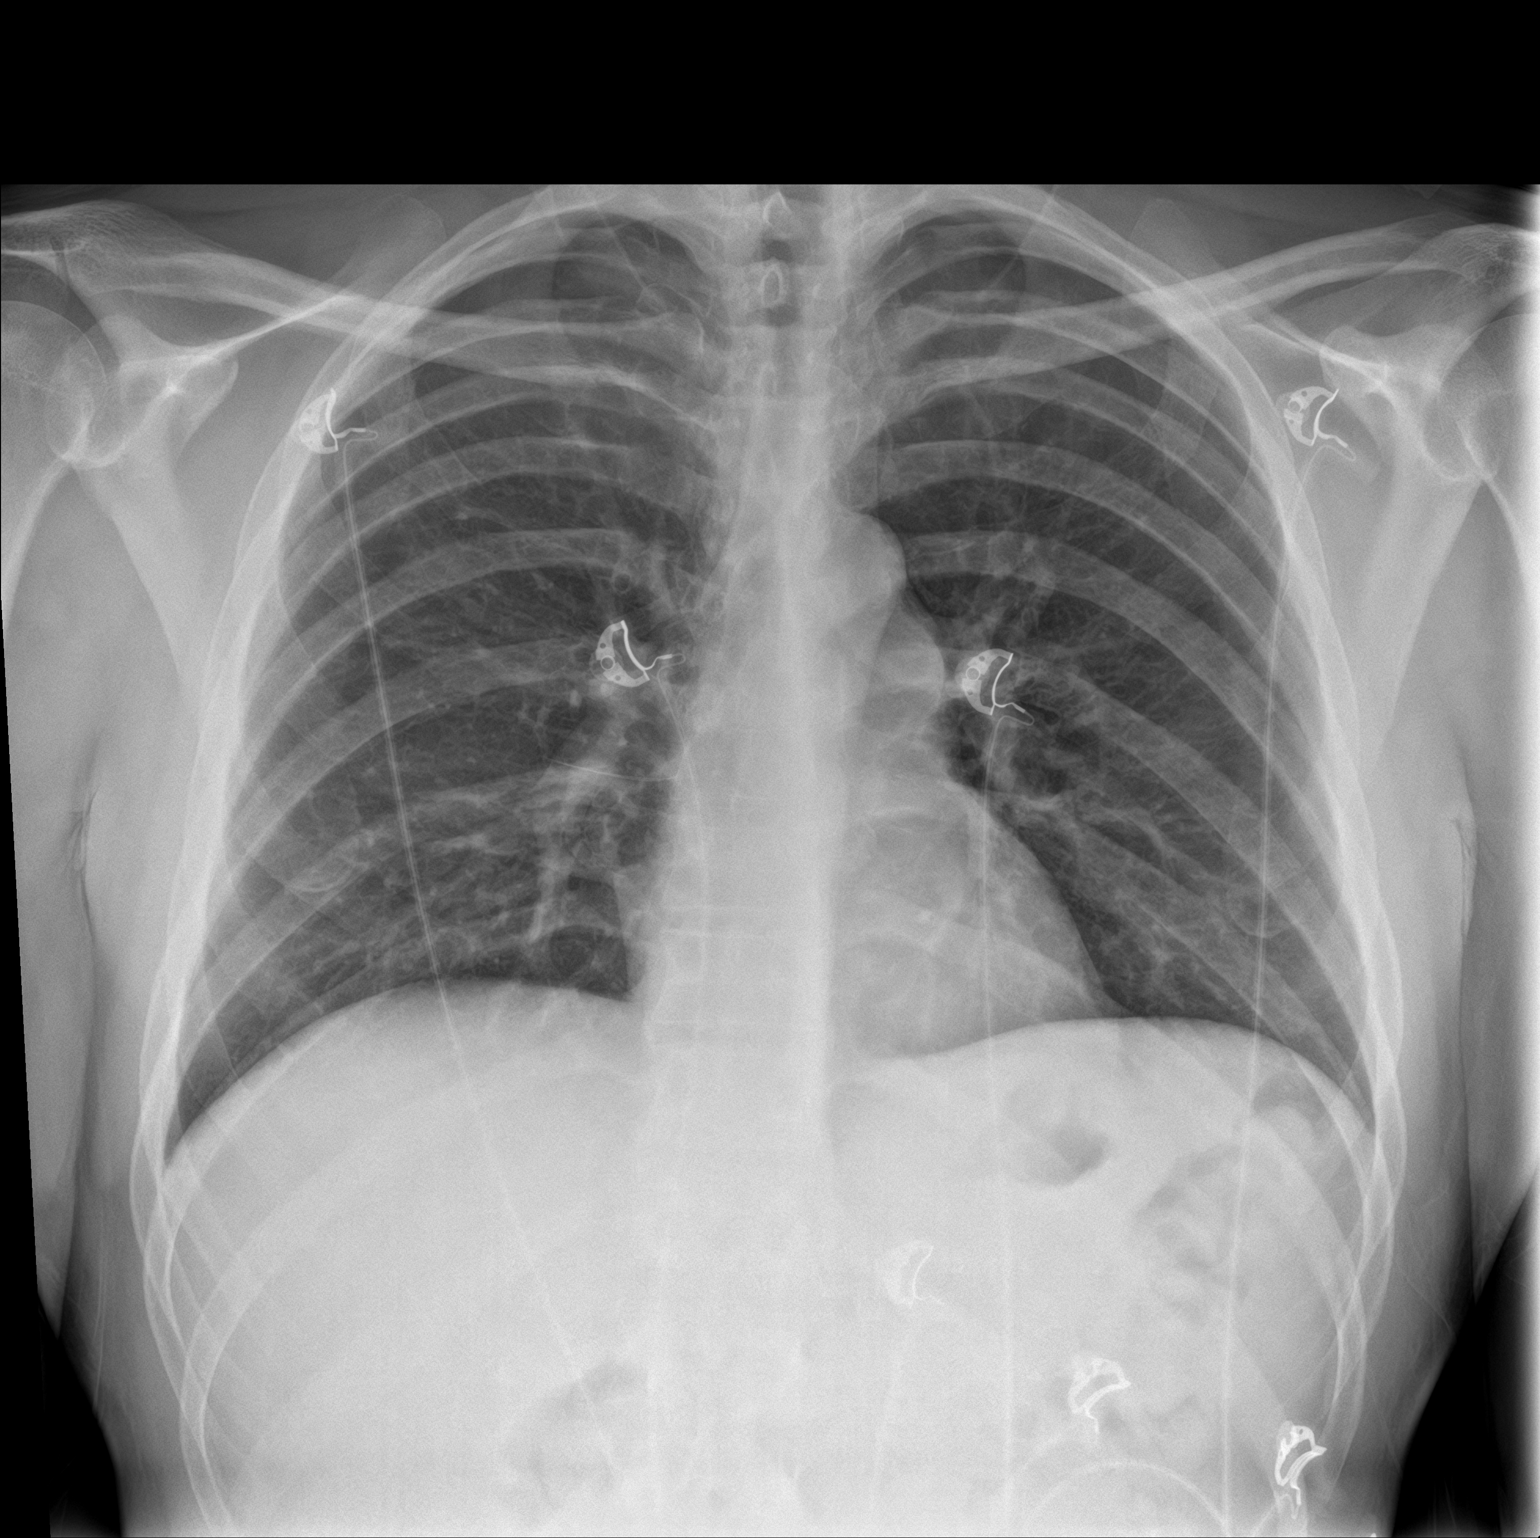

[chest lat]
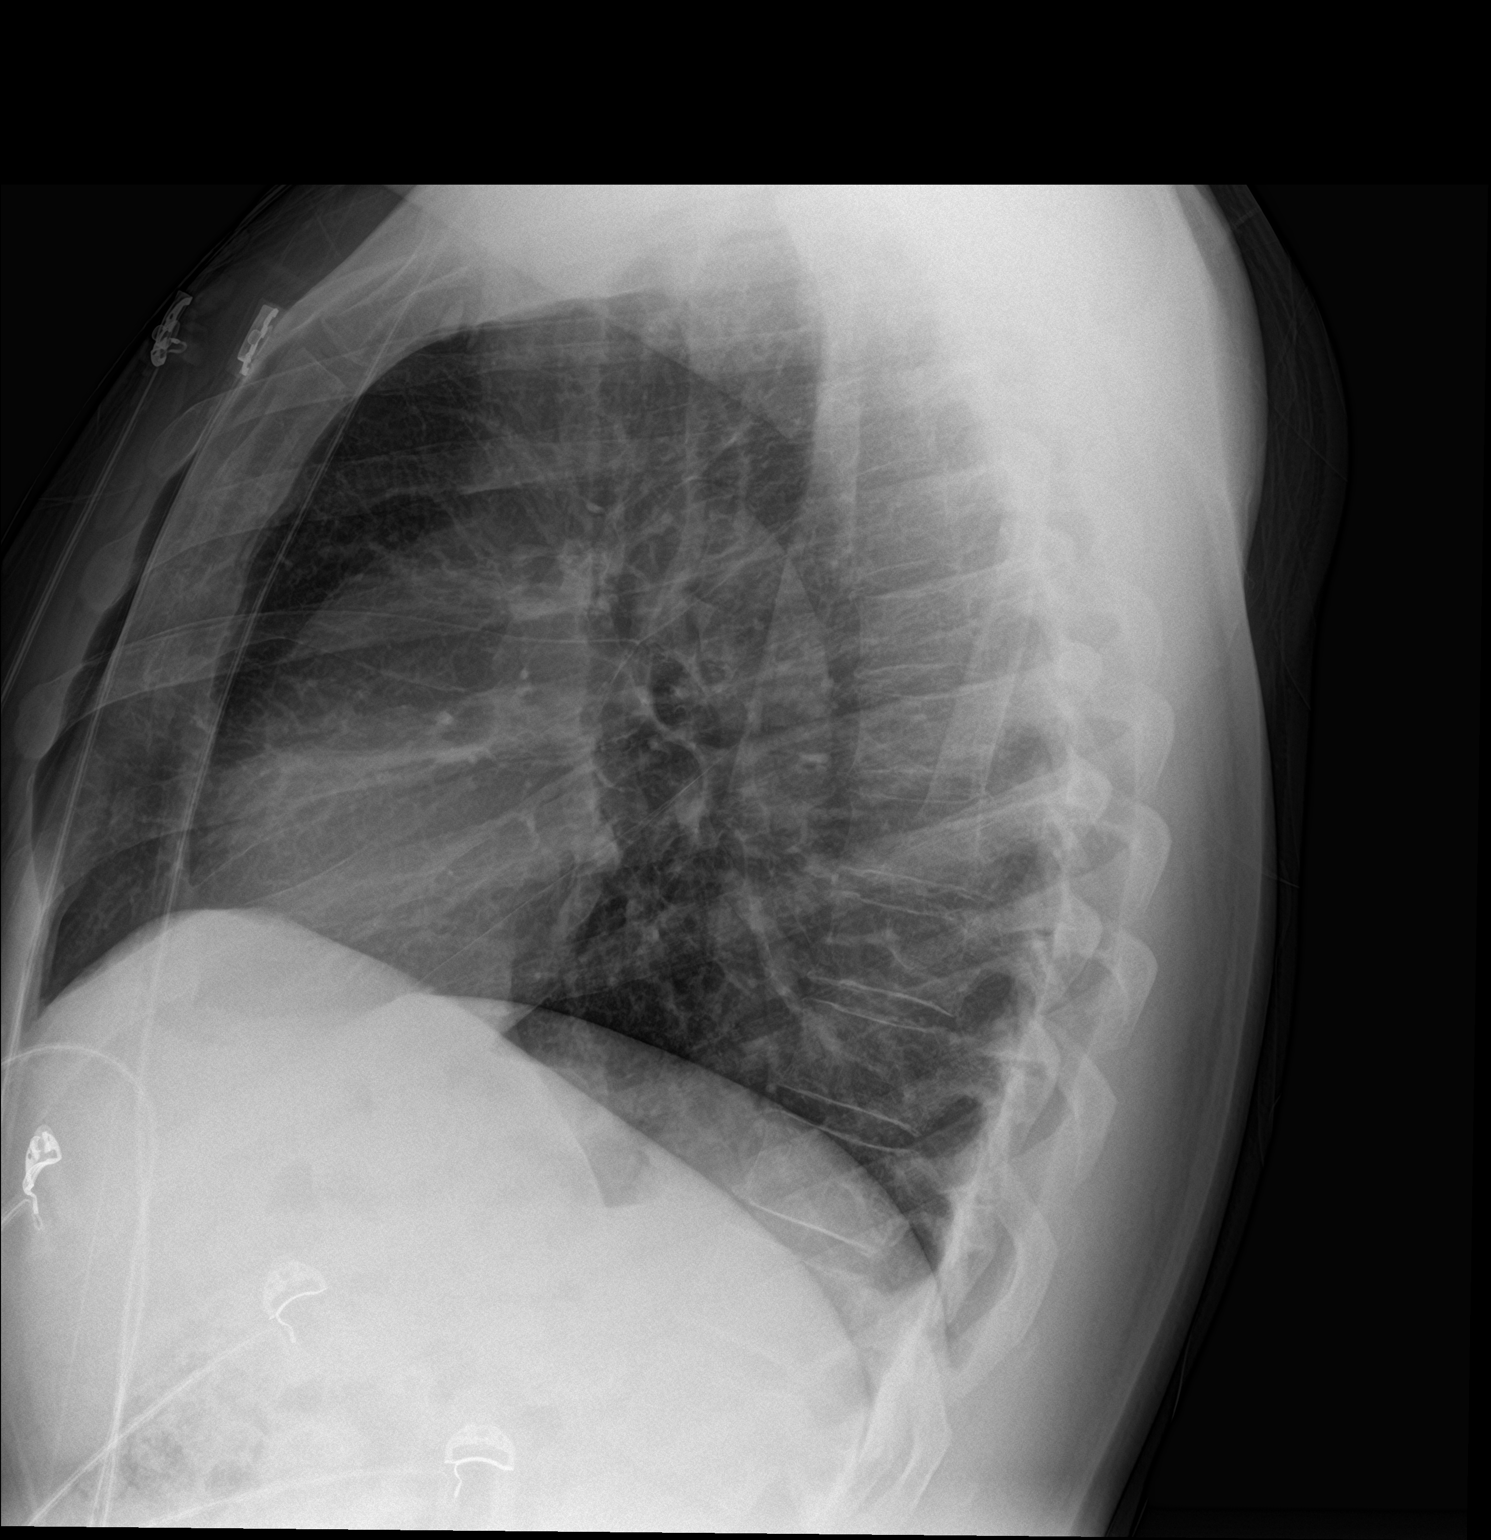

[2 of 2 positions shown; findings below may reference images not displayed]

FINDINGS: The heart size and mediastinal contours are within normal limits. No
focal airspace consolidation, pleural effusion, or pneumothorax. The
visualized skeletal structures are unremarkable.
IMPRESSION: No active cardiopulmonary disease.

## 2020-06-21 IMAGING — MR MR FOOT*R* W/O CM
5 series · 40 of 40 positions shown · non-contrast
Comparison: Radiographs [DATE]

CLINICAL DATA: Diabetic foot ulcer with diffuse pain and swelling.

EXAM:
MRI OF THE RIGHT FOREFOOT WITHOUT CONTRAST
TECHNIQUE: Multiplanar, multisequence MR imaging of the right foot was
performed. No intravenous contrast was administered.

[Series 12: STIR · oblique · right · 3.0mm · 0.62mm/px · 6 of 30 slices shown]
[im 1/30]
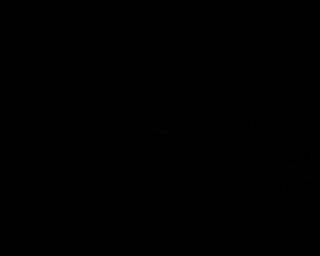
[im 6/30]
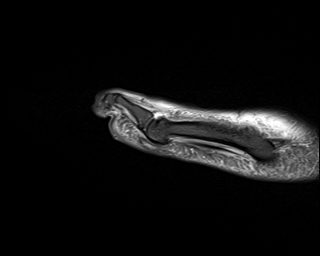
[im 12/30]
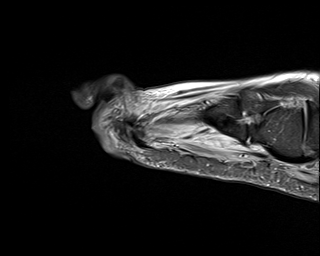
[im 18/30]
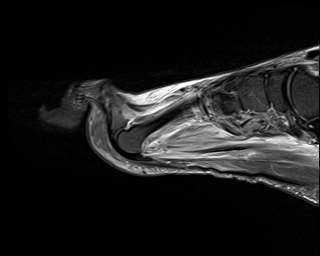
[im 24/30]
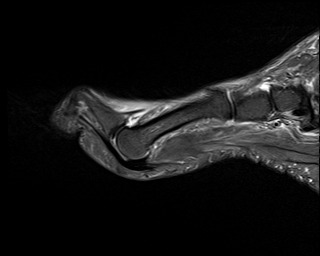
[im 30/30]
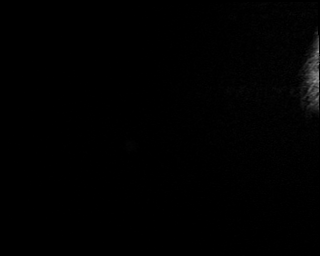

[Series 1015: axial t1_ · coronal · right · 3.0mm · 0.38mm/px · 11 of 45 slices shown]
[im 1/45]
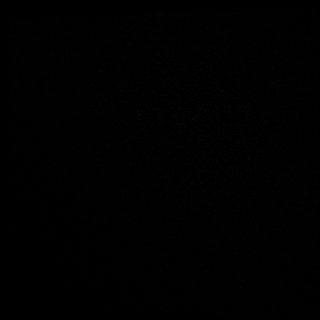
[im 5/45]
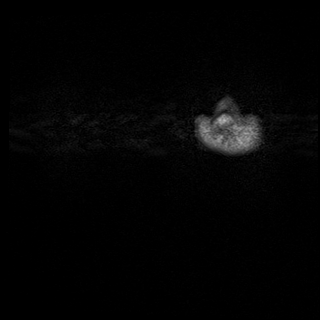
[im 9/45]
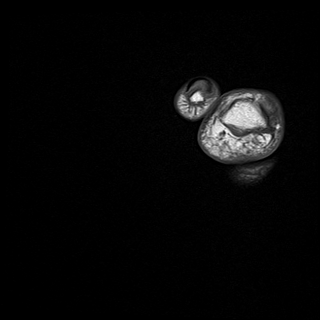
[im 14/45]
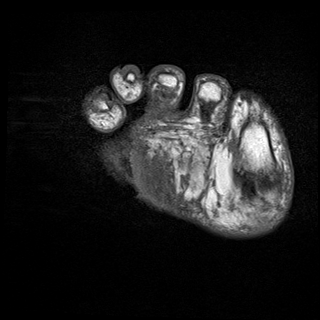
[im 18/45]
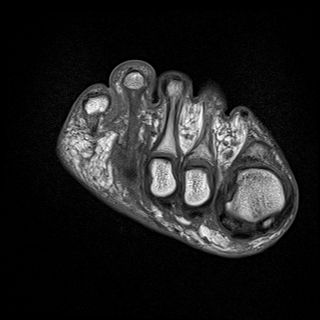
[im 23/45]
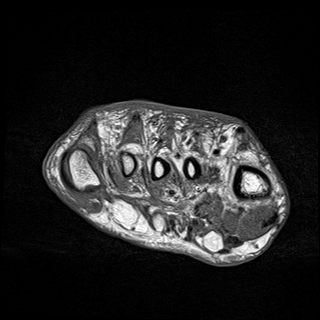
[im 27/45]
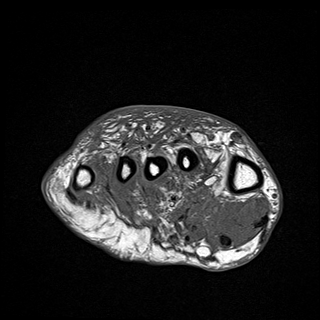
[im 31/45]
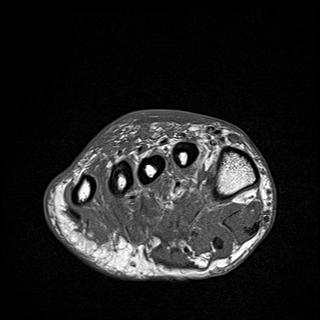
[im 36/45]
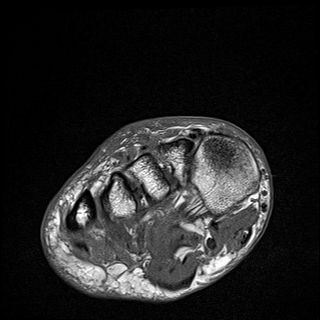
[im 40/45]
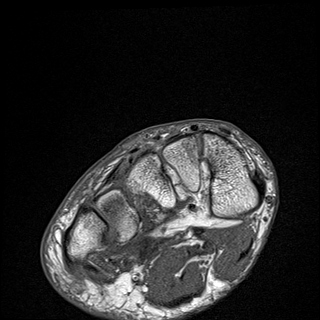
[im 45/45]
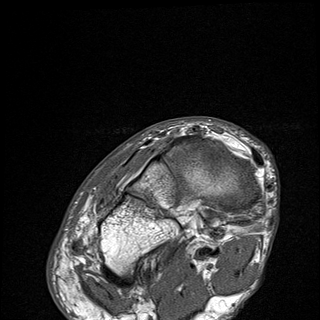

[Series 1022: T2 · coronal · right · 3.0mm · 0.38mm/px · 11 of 45 slices shown (1 of 2)]
[im 1/45]
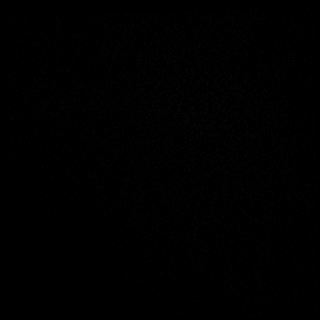
[im 5/45]
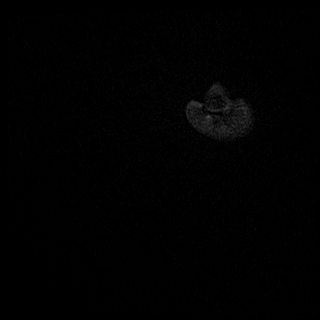
[im 9/45]
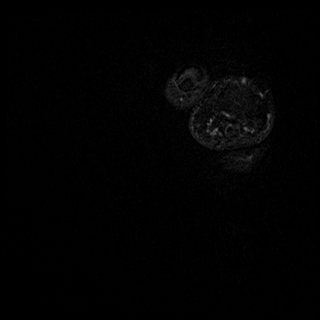
[im 14/45]
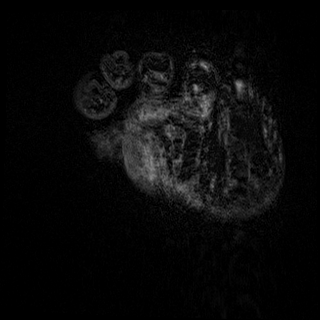
[im 18/45]
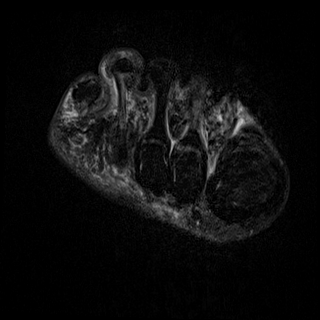
[im 23/45]
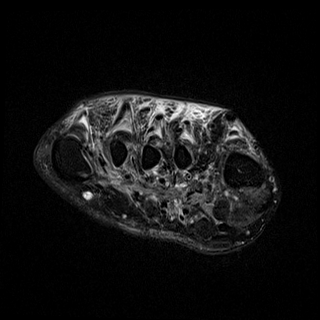
[im 27/45]
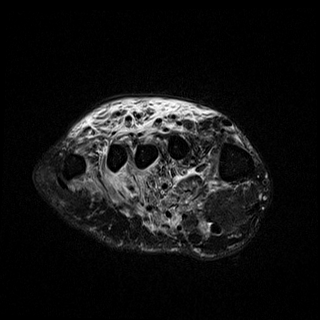
[im 31/45]
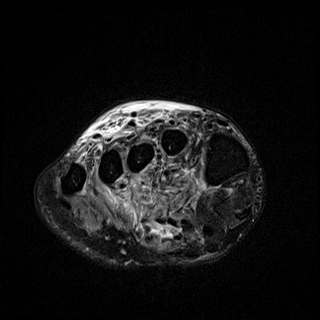
[im 36/45]
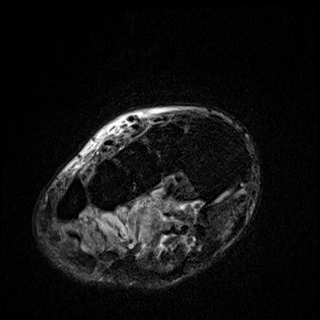
[im 40/45]
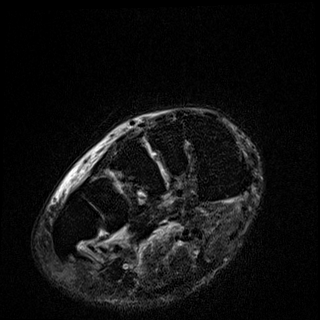
[im 45/45]
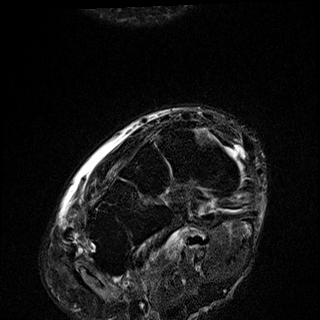

[Series 1029: cor t1_ · oblique · right · 3.0mm · 0.70mm/px · 6 of 24 slices shown]
[im 1/24]
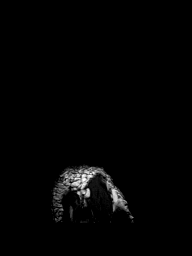
[im 5/24]
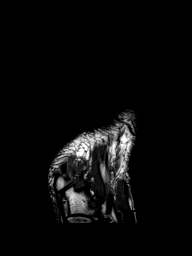
[im 10/24]
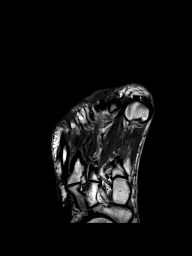
[im 14/24]
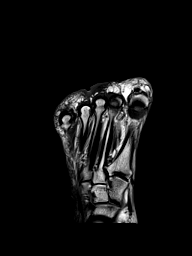
[im 19/24]
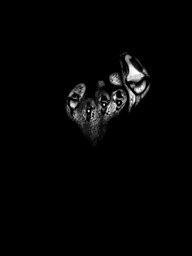
[im 24/24]
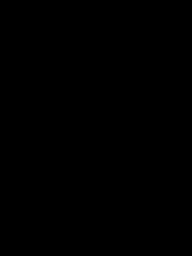

[Series 1036: T2 · oblique · right · 3.0mm · 0.70mm/px · 6 of 24 slices shown (2 of 2)]
[im 1/24]
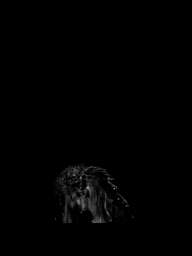
[im 5/24]
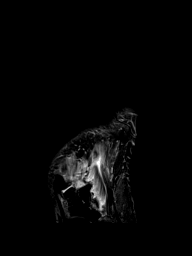
[im 10/24]
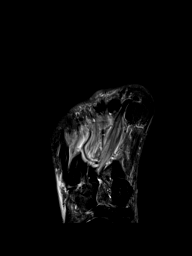
[im 14/24]
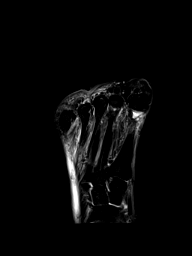
[im 19/24]
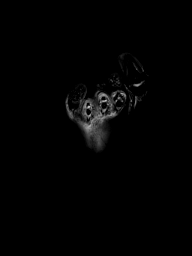
[im 24/24]
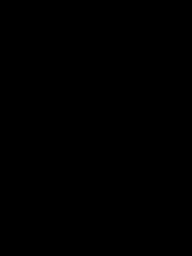

[40 of 40 positions shown; findings below may reference images not displayed]

FINDINGS: There is a small wound noted on the plantar aspect of the forefoot
overlying the region of the fourth MTP joint. No abscess is
identified. There is diffuse subcutaneous soft tissue swelling/edema
consistent with cellulitis. There is also diffuse myofasciitis but
no findings suspicious for pyomyositis.

The bony structures are intact. No findings suspicious for septic
arthritis or osteomyelitis. The visualized midfoot bony structures
are intact.

The major tendons and ligaments appear intact.
IMPRESSION: 1. Small wound on the plantar aspect of the forefoot overlying the
region of the fourth MTP joint.
2. Diffuse cellulitis and myofasciitis but no findings for soft
tissue abscess or pyomyositis.
3. No MR findings for septic arthritis or osteomyelitis.

## 2020-06-21 IMAGING — US US EXTREM LOW VENOUS
1 series · 13 of 24 positions shown · non-contrast
Comparison: [DATE] right lower extremity venous Doppler
ultrasound

CLINICAL DATA: Swelling.  Right foot infection.

EXAM:
BILATERAL LOWER EXTREMITY VENOUS DOPPLER ULTRASOUND
TECHNIQUE: Gray-scale sonography with compression, as well as color and duplex
ultrasound, were performed to evaluate the deep venous system(s)
from the level of the common femoral vein through the popliteal and
proximal calf veins.

[Series 1: us venous img lower bilat (dvt) · portal-venous · 13 of 69 slices shown]
[im 1/69]
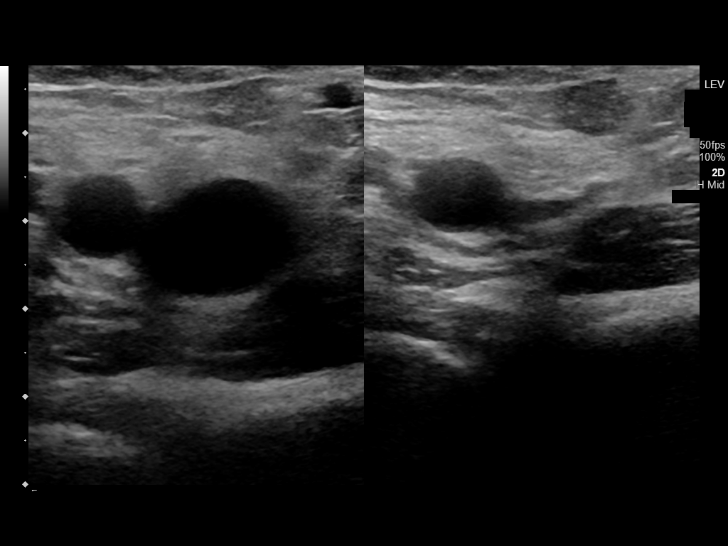
[im 6/69]
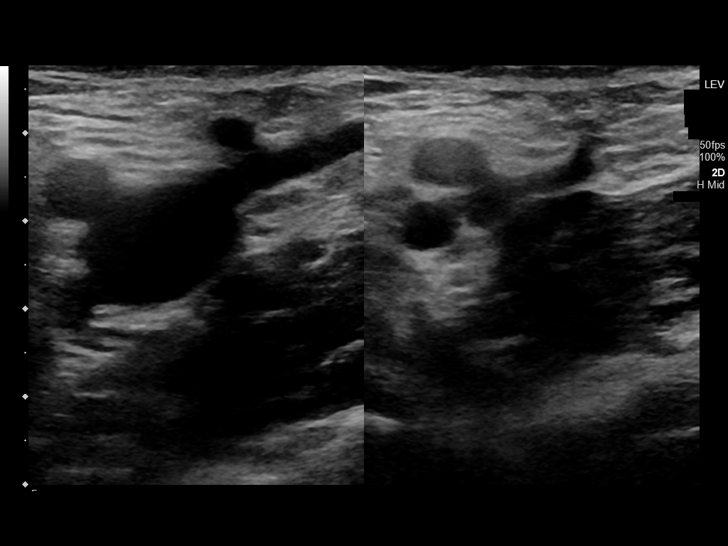
[im 12/69]
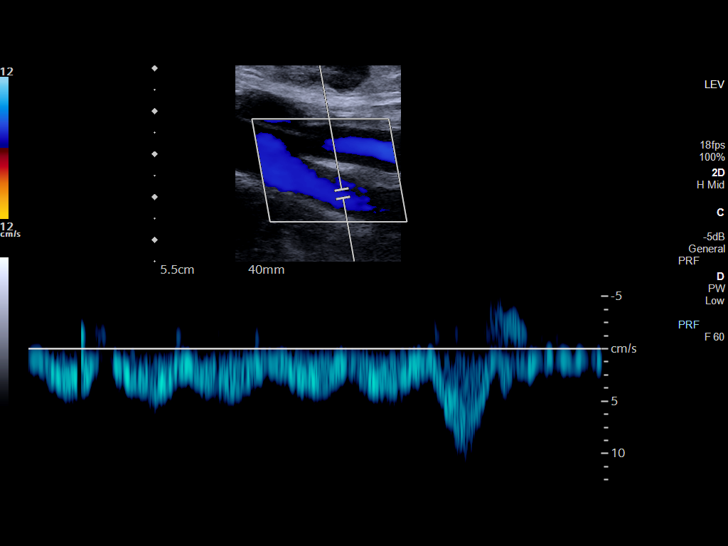
[im 18/69]
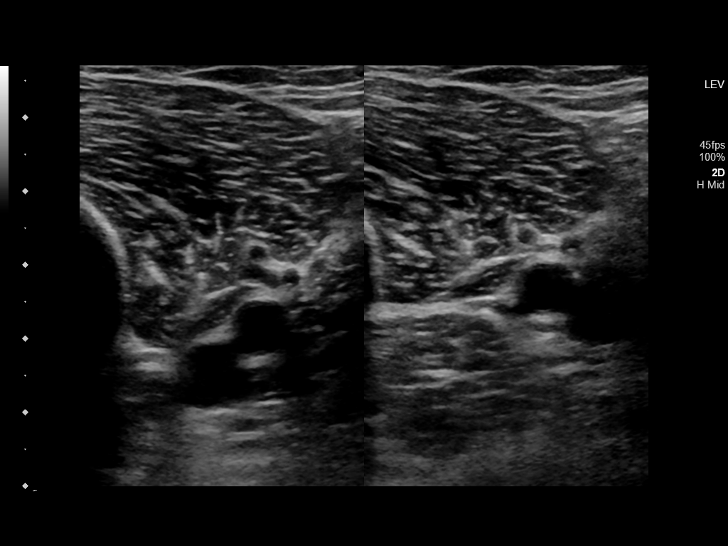
[im 24/69]
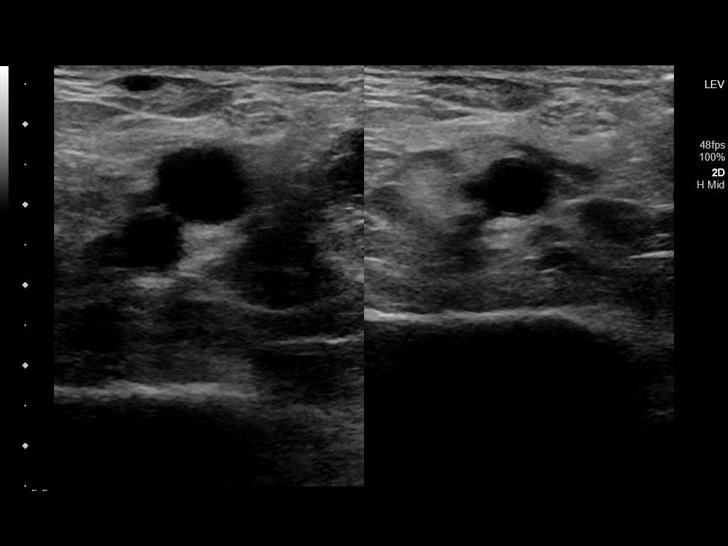
[im 30/69]
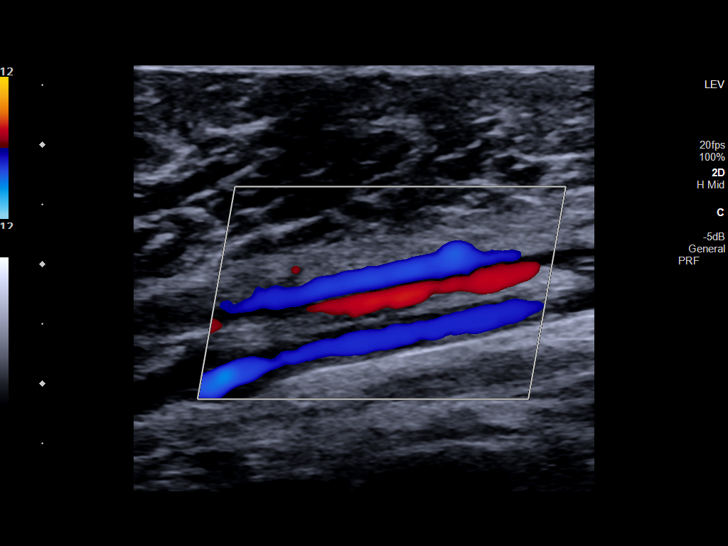
[im 36/69]
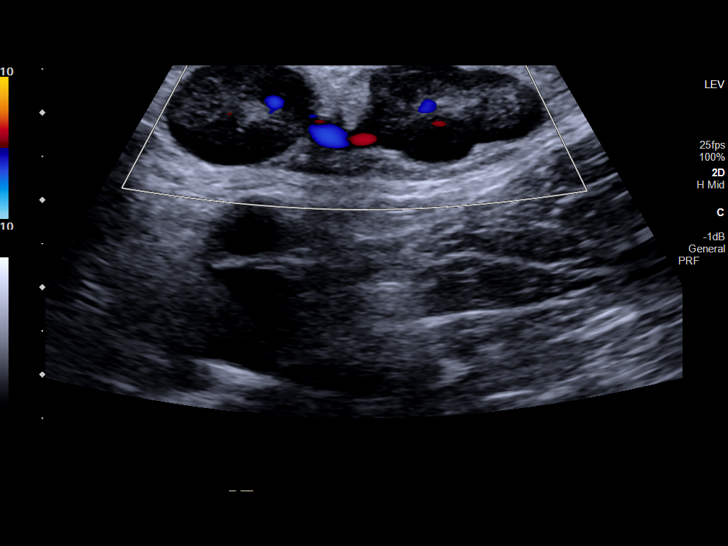
[im 39/69]
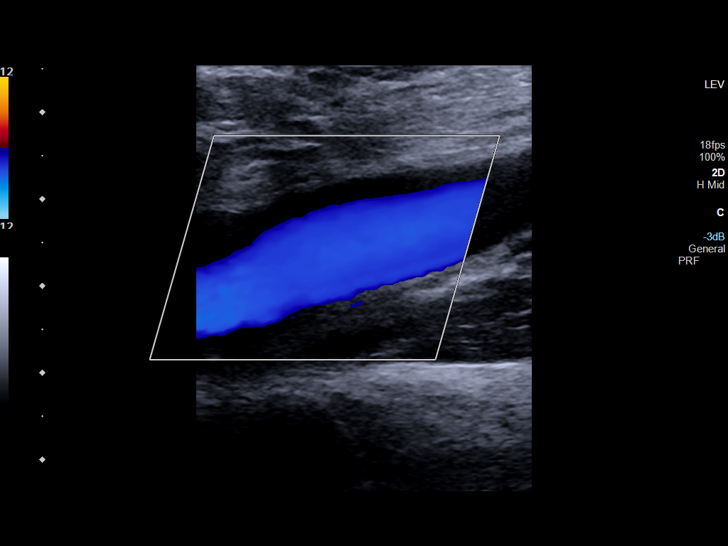
[im 45/69]
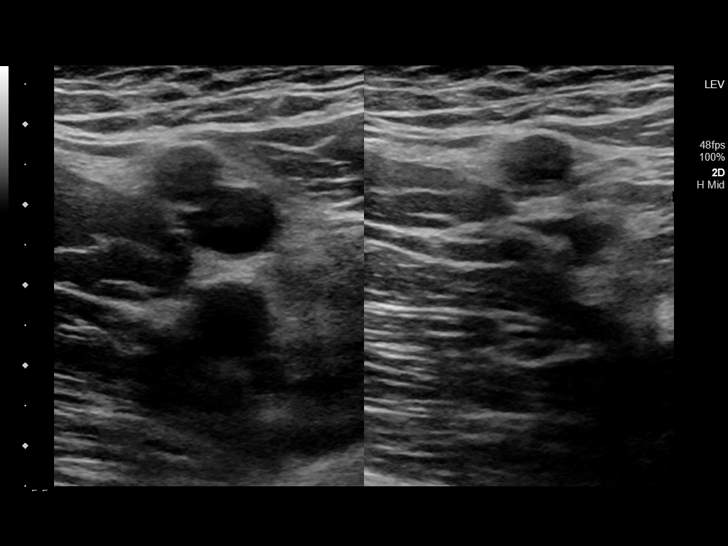
[im 51/69]
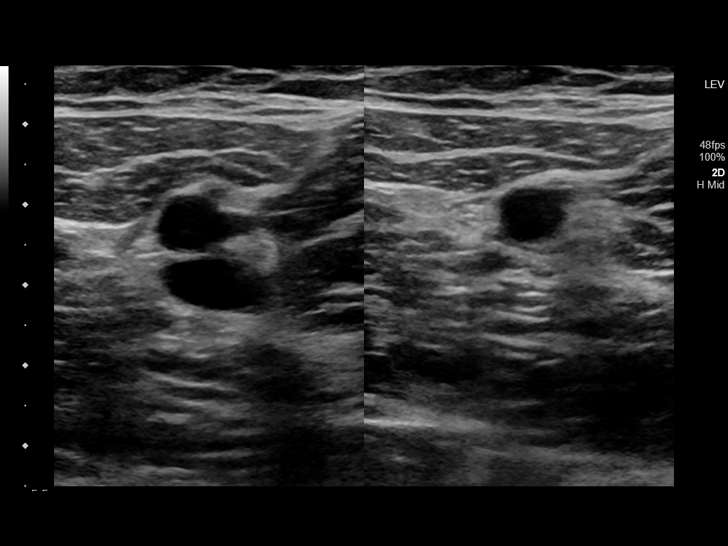
[im 57/69]
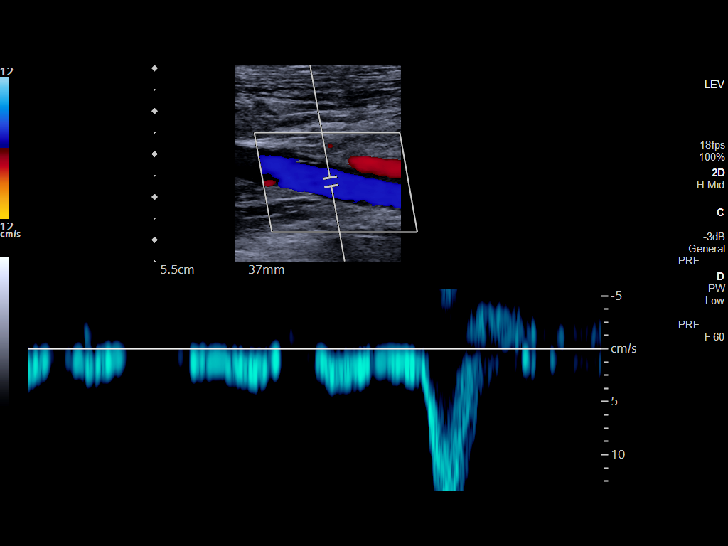
[im 63/69]
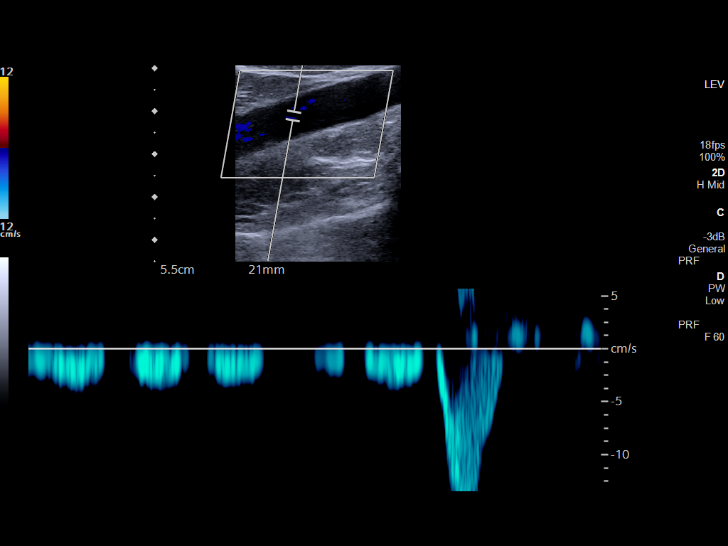
[im 69/69]
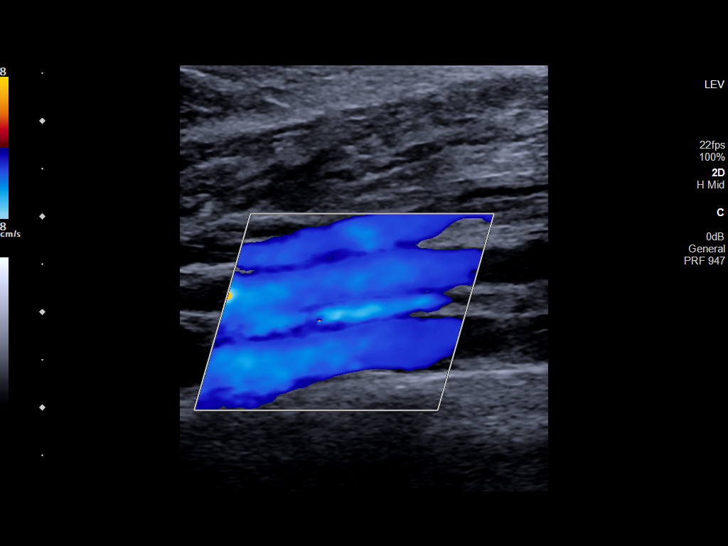

[13 of 24 positions shown; findings below may reference images not displayed]

FINDINGS: VENOUS

Normal compressibility of the common femoral, superficial femoral,
and popliteal veins, as well as the visualized calf veins.
Visualized portions of profunda femoral vein and great saphenous
vein unremarkable. No filling defects to suggest DVT on grayscale or
color Doppler imaging. Doppler waveforms show normal direction of
venous flow, normal respiratory plasticity and response to
augmentation.

OTHER

Mildly enlarged right inguinal lymph nodes are nonspecific. They are
most likely due to infectious etiology given history of right foot
infection.

Limitations: none
IMPRESSION: 1. No lower extremity DVT.
2. Mildly enlarged right inguinal lymph nodes are likely due to
inflammation. If swelling persists, repeat ultrasound evaluation
should be performed.

## 2020-06-21 IMAGING — DX DG FOOT COMPLETE 3+V*R*
3 series · 3 of 3 positions shown · non-contrast
Comparison: None.

CLINICAL DATA: Diabetic foot wound.  Possible osteomyelitis.

EXAM:
RIGHT FOOT COMPLETE - 3+ VIEW

[foot ap]
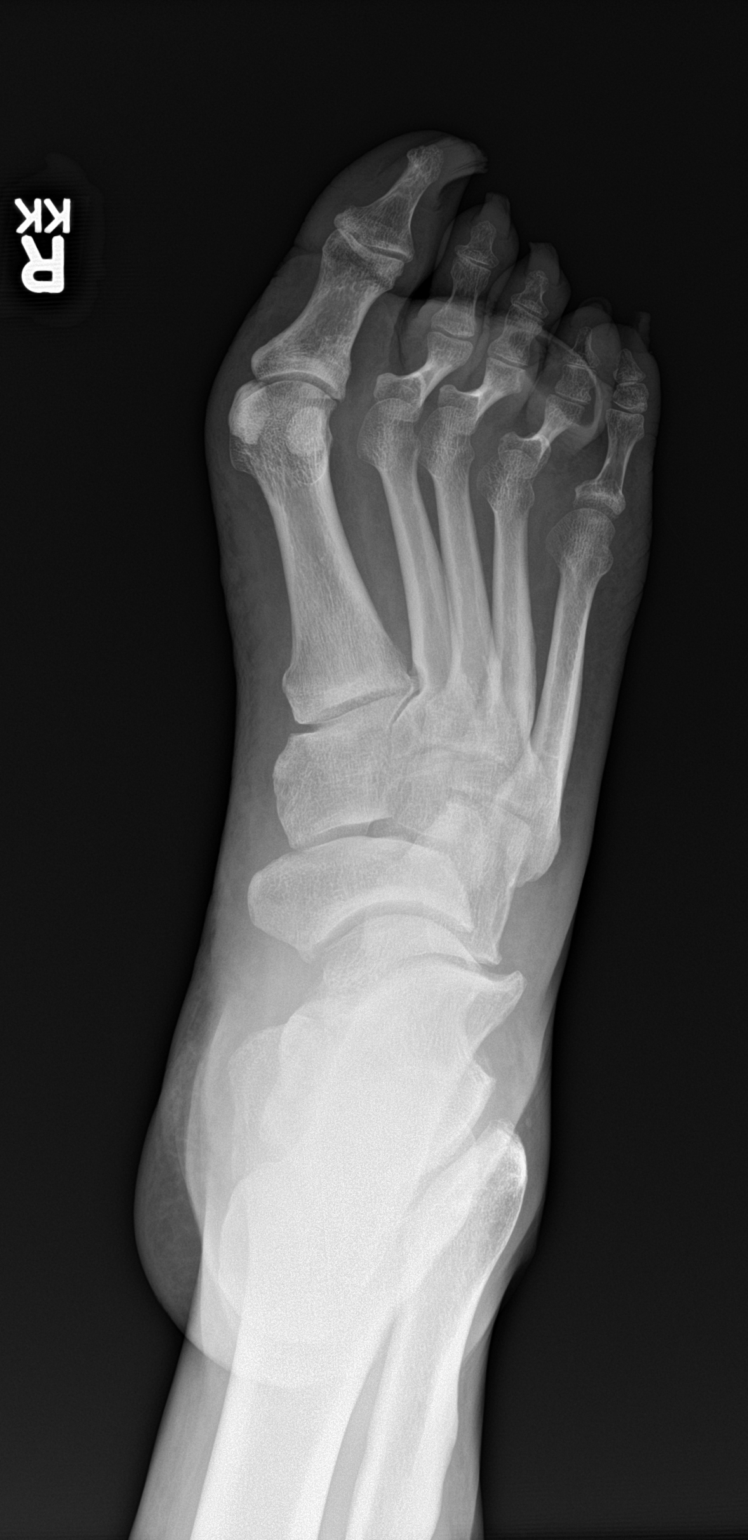

[foot obl]
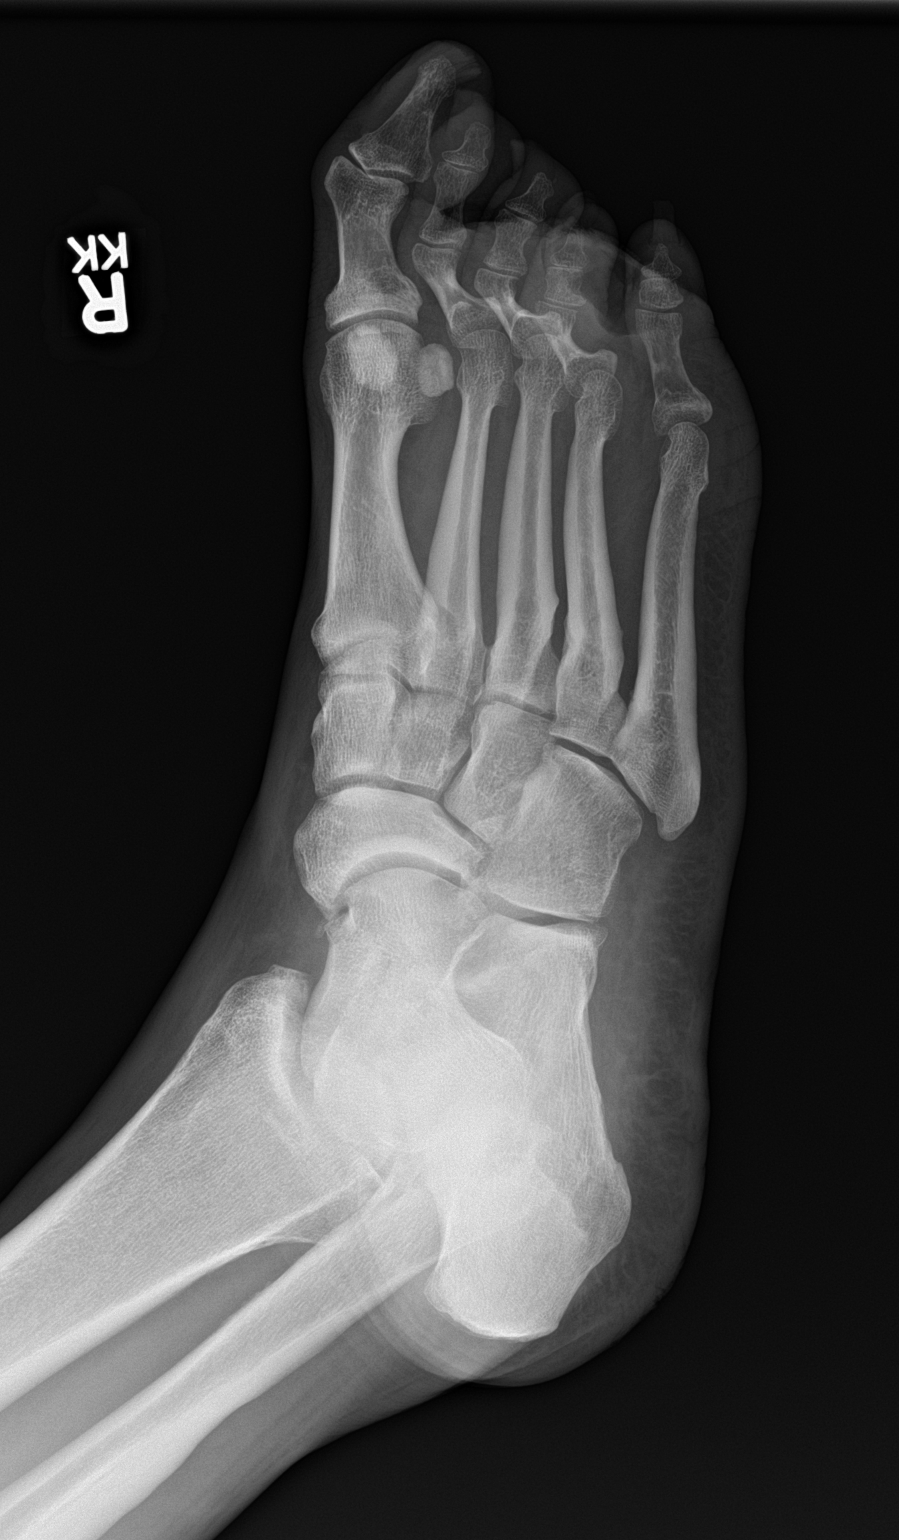

[foot lat]
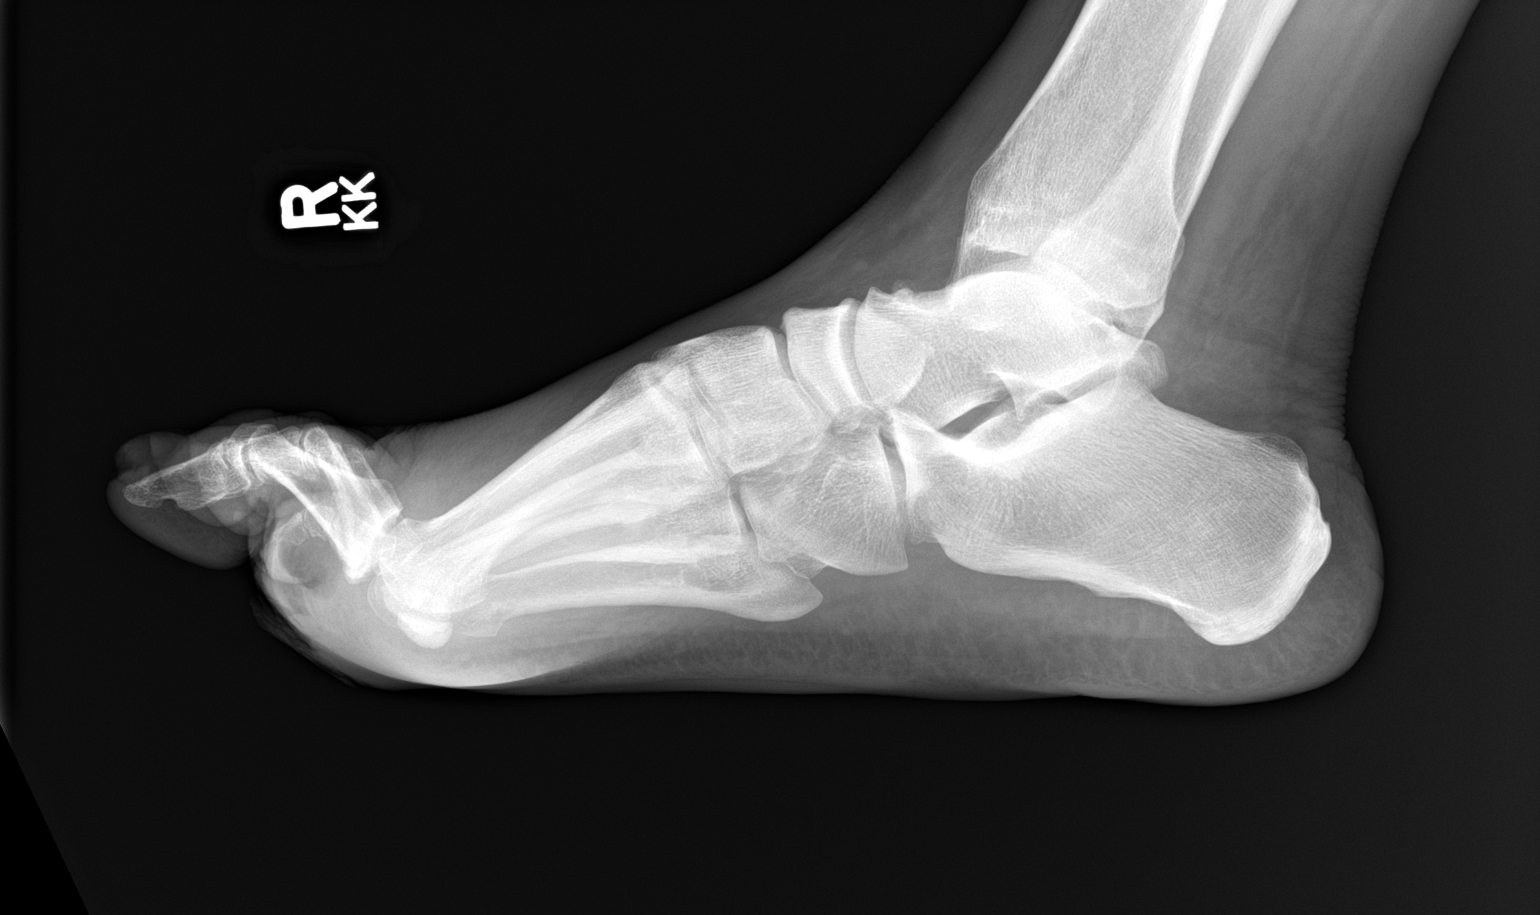

[3 of 3 positions shown; findings below may reference images not displayed]

FINDINGS: A soft tissue wound is seen along the plantar aspect of the proximal
4th and 5th toes. There is no evidence of osteolysis or periostitis.
No other bone lesions identified. No evidence of arthropathy.
IMPRESSION: Soft tissue wound along the plantar aspect of the proximal 4th and
5th toes. No radiographic evidence of osteomyelitis.

## 2020-06-21 MED ORDER — HEPARIN SODIUM (PORCINE) 5000 UNIT/ML IJ SOLN
5000.0000 [IU] | Freq: Three times a day (TID) | INTRAMUSCULAR | Status: DC
  Administered 2020-06-21 – 2020-06-22 (×3): 5000 [IU] via SUBCUTANEOUS
  Filled 2020-06-21 (×3): qty 1

## 2020-06-21 MED ORDER — LACTATED RINGERS IV SOLN: INTRAVENOUS | Status: AC

## 2020-06-21 MED ORDER — VANCOMYCIN HCL 2000 MG/400ML IV SOLN
2000.0000 mg | Freq: Once | INTRAVENOUS | Status: AC
  Administered 2020-06-21: 2000 mg via INTRAVENOUS
  Filled 2020-06-21: qty 400

## 2020-06-21 MED ORDER — LACTATED RINGERS IV BOLUS
1000.0000 mL | Freq: Once | INTRAVENOUS | Status: AC
  Administered 2020-06-21: 1000 mL via INTRAVENOUS

## 2020-06-21 MED ORDER — VANCOMYCIN HCL 1250 MG/250ML IV SOLN: 1250.0000 mg | Freq: Once | INTRAVENOUS | Status: DC

## 2020-06-21 MED ORDER — ONDANSETRON HCL 4 MG/2ML IJ SOLN: 4.0000 mg | Freq: Four times a day (QID) | INTRAMUSCULAR | Status: DC | PRN

## 2020-06-21 MED ORDER — ONDANSETRON HCL 4 MG PO TABS: 4.0000 mg | ORAL_TABLET | Freq: Four times a day (QID) | ORAL | Status: DC | PRN

## 2020-06-21 MED ORDER — ACETAMINOPHEN 650 MG RE SUPP: 650.0000 mg | Freq: Four times a day (QID) | RECTAL | Status: DC | PRN

## 2020-06-21 MED ORDER — CEFEPIME HCL 2 G IJ SOLR
2.0000 g | Freq: Once | INTRAMUSCULAR | Status: AC
  Administered 2020-06-21: 2 g via INTRAVENOUS
  Filled 2020-06-21: qty 2

## 2020-06-21 MED ORDER — INSULIN ASPART 100 UNIT/ML ~~LOC~~ SOLN
0.0000 [IU] | Freq: Three times a day (TID) | SUBCUTANEOUS | Status: DC
  Administered 2020-06-22: 3 [IU] via SUBCUTANEOUS
  Administered 2020-06-22: 8 [IU] via SUBCUTANEOUS
  Filled 2020-06-21 (×2): qty 1

## 2020-06-21 MED ORDER — HYDRALAZINE HCL 25 MG PO TABS: 25.0000 mg | ORAL_TABLET | Freq: Three times a day (TID) | ORAL | Status: DC | PRN

## 2020-06-21 MED ORDER — BENZONATATE 100 MG PO CAPS: 200.0000 mg | ORAL_CAPSULE | Freq: Three times a day (TID) | ORAL | Status: DC | PRN

## 2020-06-21 MED ORDER — INSULIN ASPART 100 UNIT/ML ~~LOC~~ SOLN
0.0000 [IU] | Freq: Every day | SUBCUTANEOUS | Status: DC
  Administered 2020-06-21: 4 [IU] via SUBCUTANEOUS
  Filled 2020-06-21: qty 1

## 2020-06-21 MED ORDER — KETOROLAC TROMETHAMINE 30 MG/ML IJ SOLN: 15.0000 mg | Freq: Three times a day (TID) | INTRAMUSCULAR | Status: AC | PRN

## 2020-06-21 MED ORDER — SODIUM CHLORIDE 0.9 % IV SOLN
1.0000 g | INTRAVENOUS | Status: DC
  Administered 2020-06-22: 1 g via INTRAVENOUS
  Filled 2020-06-21: qty 10
  Filled 2020-06-21: qty 1

## 2020-06-21 MED ORDER — ACETAMINOPHEN 325 MG PO TABS: 650.0000 mg | ORAL_TABLET | Freq: Four times a day (QID) | ORAL | Status: DC | PRN

## 2020-06-21 NOTE — ED Provider Notes (Signed)
[facility] Emergency Department Provider Note ____________________________________________   Event Date/Time   First MD Initiated Contact with Patient 06/21/20 1411     (approximate)  I have reviewed the triage vital signs and the nursing notes.  HISTORY  Chief Complaint Chest Pain   HPI Donald Carpenter is a 45 y.o. malewho presents to the ED for evaluation of chest and arm pain.   Chart review indicates no relevant medical history. Patient self-reports a history of diabetes previously prescribed insulin, but he has not taken insulin for 4 years.  Patient reports feeling "just awful" and generalized fashion that started yesterday afternoon as he was returning from work.  He reports a tingling sensation to his bilateral arms and a self resolving episode of chest pain while he was driving.  Denies exertional chest pain, syncope or pleuritic pain.  Despite being triaged as chest pain, he minimizes this and indicates that he just feels bad like he has a flu or cold.  He reports that he is fully vaccinated for COVID-19.  Of note, he does report a callus to the plantar aspect of his right foot that he has been picking at.  He tells me of this after being encouraged by his wife  While I am talking with the patient I repeat an oral temperature in the room and get 100.3 F.   Past Medical History:  Diagnosis Date  . Diabetes mellitus without complication (HCC)     There are no problems to display for this patient.   History reviewed. No pertinent surgical history.  Prior to Admission medications   Medication Sig Start Date End Date Taking? Authorizing Provider  amoxicillin-clavulanate (AUGMENTIN) 875-125 MG tablet Take 1 tablet by mouth 2 (two) times daily. 10/31/18   Triplett, Cari B, FNP  benzonatate (TESSALON) 200 MG capsule Take 1 capsule (200 mg total) by mouth 3 (three) times daily as needed for cough. 04/15/17   Fisher, Roselyn Bering, PA-C  cephALEXin (KEFLEX)  500 MG capsule Take 1 capsule (500 mg total) by mouth 4 (four) times daily. 10/19/15   Cuthriell, Delorise Royals, PA-C  HYDROcodone-homatropine (HYCODAN) 5-1.5 MG/5ML syrup Take 5 mLs by mouth every 6 (six) hours as needed for cough. Or qhs 04/15/17   Fisher, Roselyn Bering, PA-C  levofloxacin (LEVAQUIN) 500 MG tablet Take 1 tablet (500 mg total) by mouth daily. 04/15/17   Fisher, Roselyn Bering, PA-C  meloxicam (MOBIC) 15 MG tablet Take 1 tablet (15 mg total) by mouth daily. 10/19/15   Cuthriell, Delorise Royals, PA-C    Allergies Patient has no known allergies.  History reviewed. No pertinent family history.  Social History Social History   Tobacco Use  . Smoking status: Current Every Day Smoker    Packs/day: 0.50    Types: Cigarettes  . Smokeless tobacco: Never Used  Substance Use Topics  . Alcohol use: Yes  . Drug use: Not Currently    Review of Systems  Constitutional: Positive for subjective chills and generalized weakness Eyes: No visual changes. ENT: No sore throat. Cardiovascular: Positive for chest pain. Respiratory: Denies shortness of breath. Gastrointestinal: No abdominal pain.  No nausea, no vomiting.  No diarrhea.  No constipation. Genitourinary: Negative for dysuria. Musculoskeletal: Negative for back pain.  Right foot callus, swelling and pain. Skin: Negative for rash. Neurological: Negative for headaches, focal weakness or numbness.  ____________________________________________   PHYSICAL EXAM:  VITAL SIGNS: Vitals:   06/21/20 1508 06/21/20 1540  BP: 140/90   Pulse: (!) 102  Resp: 20   Temp:  100.3 F (37.9 C)  SpO2: 100%      Constitutional: Alert and oriented.  Appears tired and uncomfortable, but otherwise conversational.  Warm to the touch. Eyes: Conjunctivae are normal. PERRL. EOMI. Head: Atraumatic. Nose: No congestion/rhinnorhea. Mouth/Throat: Mucous membranes are moist.  Oropharynx non-erythematous. Neck: No stridor. No cervical spine tenderness to  palpation. Cardiovascular: Normal rate, regular rhythm. Grossly normal heart sounds.  Good peripheral circulation. Respiratory: Normal respiratory effort.  No retractions. Lungs CTAB. Gastrointestinal: Soft , nondistended, nontender to palpation. No CVA tenderness. Musculoskeletal:  Right foot is diffusely swollen, minimal erythema, but is warm to the touch.  Left foot is unremarkable. I do note a hyperkeratotic callus to the plantar aspect of his right foot beneath the second and third metatarsal. Neurologic:  Normal speech and language. No gross focal neurologic deficits are appreciated.  Skin:  Skin is warm, dry. No rash noted.  Warm to the touch Psychiatric: Mood and affect are normal. Speech and behavior are normal.  ____________________________________________   LABS (all labs ordered are listed, but only abnormal results are displayed)  Labs Reviewed  BASIC METABOLIC PANEL - Abnormal; Notable for the following components:      Result Value   Sodium 130 (*)    Chloride 94 (*)    Glucose, Bld 404 (*)    All other components within normal limits  CBC - Abnormal; Notable for the following components:   Hemoglobin 11.8 (*)    HCT 35.4 (*)    All other components within normal limits  RESP PANEL BY RT-PCR (FLU A&B, COVID) ARPGX2  LACTIC ACID, PLASMA  LACTIC ACID, PLASMA  TROPONIN I (HIGH SENSITIVITY)  TROPONIN I (HIGH SENSITIVITY)   ____________________________________________  12 Lead EKG  Sinus tachycardia, rate of 110 bpm.  Normal axis and intervals.  T wave inversions to aVL no further evidence of acute ischemia. ____________________________________________  RADIOLOGY  ED MD interpretation: 2 view CXR reviewed by me without evidence of acute cardiopulmonary pathology. Plain film of the right foot pending at the time of signout  Official radiology report(s): DG Chest 2 View  Result Date: 06/21/2020 CLINICAL DATA:  Chest pain EXAM: CHEST - 2 VIEW COMPARISON:   04/15/2017 FINDINGS: The heart size and mediastinal contours are within normal limits. No focal airspace consolidation, pleural effusion, or pneumothorax. The visualized skeletal structures are unremarkable. IMPRESSION: No active cardiopulmonary disease. Electronically Signed   By: Duanne Guess D.O.   On: 06/21/2020 14:41   DG Foot Complete Right  Result Date: 06/21/2020 CLINICAL DATA:  Diabetic foot wound.  Possible osteomyelitis. EXAM: RIGHT FOOT COMPLETE - 3+ VIEW COMPARISON:  None. FINDINGS: A soft tissue wound is seen along the plantar aspect of the proximal 4th and 5th toes. There is no evidence of osteolysis or periostitis. No other bone lesions identified. No evidence of arthropathy. IMPRESSION: Soft tissue wound along the plantar aspect of the proximal 4th and 5th toes. No radiographic evidence of osteomyelitis. Electronically Signed   By: Danae Orleans M.D.   On: 06/21/2020 15:39    ____________________________________________   PROCEDURES and INTERVENTIONS  Procedure(s) performed (including Critical Care):  .1-3 Lead EKG Interpretation Performed by: Delton Prairie, MD Authorized by: Delton Prairie, MD     Interpretation: abnormal     ECG rate:  110   ECG rate assessment: tachycardic     Rhythm: sinus tachycardia     Ectopy: none     Conduction: normal   .Critical Care Performed by:  Delton Prairie, MD Authorized by: Delton Prairie, MD   Critical care provider statement:    Critical care time (minutes):  45   Critical care was necessary to treat or prevent imminent or life-threatening deterioration of the following conditions:  Sepsis   Critical care was time spent personally by me on the following activities:  Discussions with consultants, evaluation of patient's response to treatment, examination of patient, ordering and performing treatments and interventions, ordering and review of laboratory studies, ordering and review of radiographic studies, pulse oximetry, re-evaluation of  patient's condition, obtaining history from patient or surrogate and review of old charts    Medications  lactated ringers bolus 1,000 mL (has no administration in time range)  ceFEPIme (MAXIPIME) 2 g in sodium chloride 0.9 % 100 mL IVPB (2 g Intravenous New Bag/Given 06/21/20 1522)  vancomycin (VANCOREADY) IVPB 2000 mg/400 mL (has no administration in time range)    ____________________________________________   MDM / ED COURSE   45 year old patient with history of diabetes who has not taken insulin in 4 years presents to the ED with sensation of generalized feeling unwell, and I am most concerned about sepsis due to diabetic foot infection.  Tachycardic in a sinus tach with a low-grade fever, but remains hemodynamically stable.  Exam with diffuse swelling, erythema and warmth of his right foot most concerning for diabetic foot infection considering his clinical picture.  While he is triaged as chest pain, he primarily reports to me "feeling like crap" in a generalized fashion.  I am concerned about systemic illness such as sepsis due to his right foot.  We will add on lactic acid, blood cultures, x-ray of the foot and provide empiric broad-spectrum antibiotics due to my high suspicion.  Patient signed out to oncoming divider to follow-up on x-ray and lactic acid before suspected admission     ____________________________________________   FINAL CLINICAL IMPRESSION(S) / ED DIAGNOSES  Final diagnoses:  Diabetic foot infection (HCC)  Other chest pain  Sinus tachycardia     ED Discharge Orders    None       Euretha Najarro   Note:  This document was prepared using Sales executive software and may include unintentional dictation errors.   Delton Prairie, MD 06/21/20 [phone]

## 2020-06-21 NOTE — H&P (Signed)
History and Physical   Donald Carpenter MVH:[id number] DOB: 1976-02-26 DOA: 06/21/2020  [facility]P: [facility]  Patient coming from: home  I have personally briefly reviewed patient's old medical records in [facility] EMR.  Chief Concern: Weakness and generalized malaise  HPI: Donald Carpenter is a 45 y.o. male with medical history significant for hypertension, non-insulin-dependent diabetes mellitus presents emergency department for chief concerns of right lower extremity leg wound.  He endorses that the right foot on, uploaded to media, started as a callus and he kept picking at it.  He denies pus.  He states that he has been trying to self medicate for a year however it has gotten worse and in the last week he has had increasing swelling in the right lower extremity.  He endorses feeling nausea, chills, generalized malaise and right lower extremity swelling that has worsened on Friday evening.  He states that he then went to sleep however that it did not improve.  He attempted going to work however had to leave work because he felt so weak.  This prompted him to present to the emergency department for further evaluation.  He denies any vomiting, chest pain, shortness of breath, dysuria.  He endorses subjective fever however did not take his temperature at home.  Social history: He lives at home with his wife.  He formally was in the [employer].  He is currently working as a Social worker.  He denies tobacco, EtOH, recreational drug use.  Vaccination: He is vaccinated for COVID-19  ROS: Constitutional: no weight change, no fever ENT/Mouth: no sore throat, no rhinorrhea Eyes: no eye pain, no vision changes Cardiovascular: no chest pain, no dyspnea,  no edema, no palpitations Respiratory: no cough, no sputum, no wheezing Gastrointestinal: no nausea, no vomiting, no diarrhea, no constipation Genitourinary: no urinary incontinence, no dysuria, no hematuria Musculoskeletal: no  arthralgias, no myalgias Skin: no skin lesions, no pruritus, Neuro: + weakness, no loss of consciousness, no syncope Psych: no anxiety, no depression, + decrease appetite Heme/Lymph: no bruising, no bleeding  ED Course: Discussed with ED provider, patient requiring hospitalization for right lower extremity leg.  Vitals in the emergency department was remarkable for T-max of 100.3, respiration rate of 16, heart rate 105, blood pressure 141/87, SPO2 100% on room air.  EDP gave 1 L lactated Ringer bolus, vancomycin IV, cefepime IV.  Blood cultures x2 are pending, COVID test is pending, lactic was 1.1, troponin was 13x2.  Assessment/Plan  Active Problems:   Right foot infection   Hyperglycemia   Hyperosmolar hyponatremia   # Right foot infection - Does not meet SIRS criteria - Fever and increased heart rate, no leukocytosis no lactic acid elevation - Podiatry has been consulted, Dr. Ether Griffins - Wound care has been consulted - Status post cefepime and vancomycin per EDP - Checking MRSA screening -I see no evidence of purulence or signs of Pseudomonas infection therefore I have not continued cefepime - Bilateral lower extremity DVT ultrasound ordered -Checking sed rate and CRP - Ceftriaxone 1 g IV, every 24 hours, per cellulitis order set -Pain control: Acetaminophen for mild pain, ketorolac every 8 hours as needed for moderate pain  # Hyperosmolar hyponatremia- glucose is 404, corrected sodium is 134  # Hyperglycemia-patient states that he previously was on insulin, however several years ago he went on a diet and exercise regimen and he started exercising every day and changing his diet, he lost 200 pounds and did not require insulin any longer - Checking A1c -  Insulin sliding scale with at bedtime coverage, discussed with my decision and they agree  As needed medications: Hydralazine 25 mg p.o. every 8 hours as needed for SBP greater than 170, ondansetron, ketorolac, ondansetron  Tessalon  Chart reviewed.   DVT prophylaxis: Heparin 5000 units every 8 hours, TED hose Code Status: Full code Diet: Heart healthy/carb modified Family Communication: Discussed with spouse at bedside Disposition Plan: Pending clinical course Consults called: Podiatry Admission status: MedSurg, observation, telemetry 24 hours  Past Medical History:  Diagnosis Date  . Diabetes mellitus without complication (HCC)    History reviewed. No pertinent surgical history.  Social History:  reports that he has been smoking cigarettes. He has been smoking about 0.50 packs per day. He has never used smokeless tobacco. He reports current alcohol use. He reports previous drug use.  No Known Allergies History reviewed. No pertinent family history. Family history: Family history reviewed and not pertinent  Prior to Admission medications   Medication Sig Start Date End Date Taking? Authorizing Provider  amoxicillin-clavulanate (AUGMENTIN) 875-125 MG tablet Take 1 tablet by mouth 2 (two) times daily. 10/31/18   Triplett, Cari B, FNP  benzonatate (TESSALON) 200 MG capsule Take 1 capsule (200 mg total) by mouth 3 (three) times daily as needed for cough. 04/15/17   Fisher, Roselyn Bering, PA-C  cephALEXin (KEFLEX) 500 MG capsule Take 1 capsule (500 mg total) by mouth 4 (four) times daily. 10/19/15   Cuthriell, Delorise Royals, PA-C  HYDROcodone-homatropine (HYCODAN) 5-1.5 MG/5ML syrup Take 5 mLs by mouth every 6 (six) hours as needed for cough. Or qhs 04/15/17   Fisher, Roselyn Bering, PA-C  levofloxacin (LEVAQUIN) 500 MG tablet Take 1 tablet (500 mg total) by mouth daily. 04/15/17   Fisher, Roselyn Bering, PA-C  meloxicam (MOBIC) 15 MG tablet Take 1 tablet (15 mg total) by mouth daily. 10/19/15   Racheal Patches, PA-C   Physical Exam: Vitals:   06/21/20 1630 06/21/20 1700 06/21/20 1730 06/21/20 1818  BP: (!) 157/102 131/82 123/89 (!) 155/85  Pulse: (!) 115 (!) 104 (!) 104 95  Resp:    18  Temp:    (!) 100.6 F (38.1 C)   TempSrc:      SpO2: 100% 100% 99% 100%  Weight:      Height:       Constitutional: appears age-appropriate, NAD, calm, comfortable Eyes: PERRL, lids and conjunctivae normal ENMT: Mucous membranes are moist. Posterior pharynx clear of any exudate or lesions. Age-appropriate dentition. Hearing appropriate Neck: normal, supple, no masses, no thyromegaly Respiratory: clear to auscultation bilaterally, no wheezing, no crackles. Normal respiratory effort. No accessory muscle use.  Cardiovascular: Regular rate and rhythm, no murmurs / rubs / gallops. No extremity edema. 2+ pedal pulses. No carotid bruits.  Abdomen: no tenderness, no masses palpated, no hepatosplenomegaly. Bowel sounds positive.  Musculoskeletal: no clubbing / cyanosis. No joint deformity upper and lower extremities. Good ROM, no contractures, no atrophy. Normal muscle tone.  Skin: no rashes, lesions, ulcers. No induration Neurologic: Sensation intact. Strength 5/5 in all 4.  Psychiatric: Normal judgment and insight. Alert and oriented x 3. Normal mood.   EKG: Not indicated  Chest x-ray on Admission: I personally reviewed and I agree with radiologist reading as below.  DG Chest 2 View  Result Date: 06/21/2020 CLINICAL DATA:  Chest pain EXAM: CHEST - 2 VIEW COMPARISON:  04/15/2017 FINDINGS: The heart size and mediastinal contours are within normal limits. No focal airspace consolidation, pleural effusion, or pneumothorax. The visualized skeletal structures  are unremarkable. IMPRESSION: No active cardiopulmonary disease. Electronically Signed   By: Duanne Guess D.O.   On: 06/21/2020 14:41   DG Foot Complete Right  Result Date: 06/21/2020 CLINICAL DATA:  Diabetic foot wound.  Possible osteomyelitis. EXAM: RIGHT FOOT COMPLETE - 3+ VIEW COMPARISON:  None. FINDINGS: A soft tissue wound is seen along the plantar aspect of the proximal 4th and 5th toes. There is no evidence of osteolysis or periostitis. No other bone lesions  identified. No evidence of arthropathy. IMPRESSION: Soft tissue wound along the plantar aspect of the proximal 4th and 5th toes. No radiographic evidence of osteomyelitis. Electronically Signed   By: Danae Orleans M.D.   On: 06/21/2020 15:39   Labs on Admission: I have personally reviewed following labs  CBC: Recent Labs  Lab 06/21/20 1408  WBC 8.6  HGB 11.8*  HCT 35.4*  MCV 81.8  PLT 175   Basic Metabolic Panel: Recent Labs  Lab 06/21/20 1408  NA 130*  K 4.0  CL 94*  CO2 26  GLUCOSE 404*  BUN 18  CREATININE 1.12  CALCIUM 8.9   GFR: Estimated Creatinine Clearance: 99.4 mL/min (by C-G formula based on SCr of 1.12 mg/dL).  CBG: Recent Labs  Lab 06/21/20 1838  GLUCAP 259*   Urine analysis:    Component Value Date/Time   COLORURINE Yellow 03/29/2011 0638   APPEARANCEUR Clear 03/29/2011 0638   LABSPEC 1.032 03/29/2011 0638   PHURINE 5.0 03/29/2011 0638   GLUCOSEU >=500 03/29/2011 0638   HGBUR 1+ 03/29/2011 0638   BILIRUBINUR Negative 03/29/2011 0638   KETONESUR 2+ 03/29/2011 0638   PROTEINUR 100 mg/dL 25/06/3974 7341   NITRITE Negative 03/29/2011 0638   LEUKOCYTESUR Negative 03/29/2011 0638   Gisella Alwine N Aleina Burgio D.O. [facility]  If 7PM-7AM, please contact overnight-coverage provider If 7AM-7PM, please contact day coverage provider [email or web address]  06/21/2020, 7:38 PM

## 2020-06-21 NOTE — Consult Note (Signed)
ORTHOPAEDIC CONSULTATION  REQUESTING PHYSICIAN: Cox, Amy N, DO  Chief Complaint: Right foot swelling and pain  HPI: Donald Carpenter is a 45 y.o. male who complains of worsening swelling and pain with generalized malaise and fever.  History of diabetes but not on diabetic medication currently.  States has had a sore on the bottom of his foot for some time.  Was a callus that he trimmed out but has not been touching of recent.  He has had worsening swelling and soreness into this right foot.  Recent fever.  Seen in the ER and admitted for evaluation of this diabetic foot ulceration site.  Blood sugar upon admission 404.  He denies overt numbness to the feet.  Past Medical History:  Diagnosis Date  . Diabetes mellitus without complication (HCC)    History reviewed. No pertinent surgical history. Social History   Socioeconomic History  . Marital status: Married    Spouse name: Not on file  . Number of children: Not on file  . Years of education: Not on file  . Highest education level: Not on file  Occupational History  . Not on file  Tobacco Use  . Smoking status: Current Every Day Smoker    Packs/day: 0.50    Types: Cigarettes  . Smokeless tobacco: Never Used  Substance and Sexual Activity  . Alcohol use: Yes  . Drug use: Not Currently  . Sexual activity: Not on file  Other Topics Concern  . Not on file  Social History Narrative  . Not on file   Social Determinants of Health   Financial Resource Strain: Not on file  Food Insecurity: Not on file  Transportation Needs: Not on file  Physical Activity: Not on file  Stress: Not on file  Social Connections: Not on file   History reviewed. No pertinent family history. No Known Allergies Prior to Admission medications   Medication Sig Start Date End Date Taking? Authorizing Provider  amoxicillin-clavulanate (AUGMENTIN) 875-125 MG tablet Take 1 tablet by mouth 2 (two) times daily. 10/31/18   Triplett, Cari B, FNP  benzonatate  (TESSALON) 200 MG capsule Take 1 capsule (200 mg total) by mouth 3 (three) times daily as needed for cough. 04/15/17   Fisher, Roselyn Bering, PA-C  cephALEXin (KEFLEX) 500 MG capsule Take 1 capsule (500 mg total) by mouth 4 (four) times daily. 10/19/15   Cuthriell, Delorise Royals, PA-C  HYDROcodone-homatropine (HYCODAN) 5-1.5 MG/5ML syrup Take 5 mLs by mouth every 6 (six) hours as needed for cough. Or qhs 04/15/17   Fisher, Roselyn Bering, PA-C  levofloxacin (LEVAQUIN) 500 MG tablet Take 1 tablet (500 mg total) by mouth daily. 04/15/17   Fisher, Roselyn Bering, PA-C  meloxicam (MOBIC) 15 MG tablet Take 1 tablet (15 mg total) by mouth daily. 10/19/15   Cuthriell, Delorise Royals, PA-C   DG Chest 2 View  Result Date: 06/21/2020 CLINICAL DATA:  Chest pain EXAM: CHEST - 2 VIEW COMPARISON:  04/15/2017 FINDINGS: The heart size and mediastinal contours are within normal limits. No focal airspace consolidation, pleural effusion, or pneumothorax. The visualized skeletal structures are unremarkable. IMPRESSION: No active cardiopulmonary disease. Electronically Signed   By: Duanne Guess D.O.   On: 06/21/2020 14:41   DG Foot Complete Right  Result Date: 06/21/2020 CLINICAL DATA:  Diabetic foot wound.  Possible osteomyelitis. EXAM: RIGHT FOOT COMPLETE - 3+ VIEW COMPARISON:  None. FINDINGS: A soft tissue wound is seen along the plantar aspect of the proximal 4th and 5th toes. There is no evidence  of osteolysis or periostitis. No other bone lesions identified. No evidence of arthropathy. IMPRESSION: Soft tissue wound along the plantar aspect of the proximal 4th and 5th toes. No radiographic evidence of osteomyelitis. Electronically Signed   By: Danae Orleans M.D.   On: 06/21/2020 15:39    Positive ROS: All other systems have been reviewed and were otherwise negative with the exception of those mentioned in the HPI and as above.  12 point ROS was performed.  Physical Exam: General: Alert and oriented.  No apparent distress.  Vascular:  Left  foot:Dorsalis Pedis:  present Posterior Tibial:  present  Right foot: Dorsalis Pedis:  present Posterior Tibial:  present  Neuro:absent protective sensation but gross sensation light touch are intact.  Derm: Left foot without ulceration.  Plantar right foot with large area of hyperkeratotic tissue.  With palpation there was fluctuance deep to the area.  Upon debridement there was an area of obvious abscess material.  I did culture this.  There is a central ulceration that probes through the epidermis and dermal layer at this time surrounding macerated tissue was noted upon debridement.  Treatment tissue distally under the lesser toes was removed.  Healthy tissue was noted with exposure of dermal layer.  See clinical picture  Postdebridement      Ortho/MS: Hammertoe contracture of the lesser toes causing pressure induced lesion to the forefoot area.  Mild edema.  No crepitance with range of motion of the foot.  No pain into the arch.  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  A wound culture was performed at this time.  X-rays are negative.  I am going to order an MRI just to further evaluate for possible osteomyelitis versus deep abscess in this area.  If MRI is negative will likely just need local wound care and close follow-up in the outpatient clinic.  If shows abscess or osteomyelitis can discuss surgical debridement.  Ordered an offloading forefoot relief shoe.  Bulky padded bandage was applied.  We will follow while in house.  Debridement ulcer plantar right foot: Debridement of the plantar right foot ulceration was performed with a 15 blade.  Predebridement measurements showed complete coverage of the tissue with hyperkeratotic and abscess material.  The abscess was then drained with a 15 blade.   Purulence was expressed.  The wound culture was taken.  Further debridement of the central ulcerative site was performed with a 15 blade and a small area of breakdown through the dermal layer into subcutaneous tissue was excised.  A bulky padded bandage was applied.  The central ulcerative site postdebridement measured approximately centimeter with all the surrounding macerated tissue at about 3-1/2 cm.    Irean Hong, DPM Cell [phone]   06/21/2020 7:23 PM

## 2020-06-21 NOTE — ED Triage Notes (Signed)
Patient c/o left, squeezing chest pain that started yesterday. Also c/o pain in bilateral arms

## 2020-06-21 NOTE — Plan of Care (Signed)
  Problem: Education: Goal: Knowledge of General Education information will improve Description: Including pain rating scale, medication(s)/side effects and non-pharmacologic comfort measures Outcome: Progressing   Problem: Clinical Measurements: Goal: Diagnostic test results will improve Outcome: Progressing   Problem: Coping: Goal: Level of anxiety will decrease Outcome: Progressing   Problem: Pain Managment: Goal: General experience of comfort will improve Outcome: Progressing   Problem: Safety: Goal: Ability to remain free from injury will improve Outcome: Progressing   Problem: Skin Integrity: Goal: Risk for impaired skin integrity will decrease Outcome: Progressing   Problem: Fluid Volume: Goal: Ability to maintain a balanced intake and output will improve Outcome: Progressing   Problem: Tissue Perfusion: Goal: Adequacy of tissue perfusion will improve Outcome: Progressing

## 2020-06-22 DIAGNOSIS — L089 Local infection of the skin and subcutaneous tissue, unspecified: Secondary | ICD-10-CM | POA: Diagnosis not present

## 2020-06-22 DIAGNOSIS — R739 Hyperglycemia, unspecified: Secondary | ICD-10-CM

## 2020-06-22 LAB — COMPREHENSIVE METABOLIC PANEL
ALT: 13 U/L (ref 0–44)
AST: 13 U/L — ABNORMAL LOW (ref 15–41)
Albumin: 3.3 g/dL — ABNORMAL LOW (ref 3.5–5.0)
Alkaline Phosphatase: 42 U/L (ref 38–126)
Anion gap: 8 (ref 5–15)
BUN: 17 mg/dL (ref 6–20)
CO2: 25 mmol/L (ref 22–32)
Calcium: 8.4 mg/dL — ABNORMAL LOW (ref 8.9–10.3)
Chloride: 98 mmol/L (ref 98–111)
Creatinine, Ser: 0.99 mg/dL (ref 0.61–1.24)
GFR, Estimated: >60 mL/min (ref >60)
Glucose, Bld: 271 mg/dL — ABNORMAL HIGH (ref 70–99)
Potassium: 3.5 mmol/L (ref 3.5–5.1)
Sodium: 131 mmol/L — ABNORMAL LOW (ref 135–145)
Total Bilirubin: 0.7 mg/dL (ref 0.3–1.2)
Total Protein: 6 g/dL — ABNORMAL LOW (ref 6.5–8.1)

## 2020-06-22 LAB — CBC
HCT: 31.7 % — ABNORMAL LOW (ref 39.0–52.0)
Hemoglobin: 10.6 g/dL — ABNORMAL LOW (ref 13.0–17.0)
MCH: 27.5 pg (ref 26.0–34.0)
MCHC: 33.4 g/dL (ref 30.0–36.0)
MCV: 82.1 fL (ref 80.0–100.0)
Platelets: 150 10*3/uL (ref 150–400)
RBC: 3.86 MIL/uL — ABNORMAL LOW (ref 4.22–5.81)
RDW: 12.9 % (ref 11.5–15.5)
WBC: 6.6 10*3/uL (ref 4.0–10.5)
nRBC: 0 % (ref 0.0–0.2)

## 2020-06-22 LAB — GLUCOSE, CAPILLARY
Glucose-Capillary: 264 mg/dL — ABNORMAL HIGH (ref 70–99)
Glucose-Capillary: 171 mg/dL — ABNORMAL HIGH (ref 70–99)

## 2020-06-22 LAB — MRSA PCR SCREENING: MRSA by PCR: NEGATIVE

## 2020-06-22 MED ORDER — AMOXICILLIN-POT CLAVULANATE 875-125 MG PO TABS: 1.0000 | ORAL_TABLET | Freq: Two times a day (BID) | ORAL | 0 refills | Status: AC

## 2020-06-22 MED ORDER — HYDROCODONE-ACETAMINOPHEN 5-325 MG PO TABS: 1.0000 | ORAL_TABLET | ORAL | 0 refills | Status: DC | PRN

## 2020-06-22 MED ORDER — BLOOD GLUCOSE MONITOR KIT: PACK | 0 refills | Status: AC

## 2020-06-22 MED ORDER — PEN NEEDLES 32G X 4 MM MISC: 1.0000 | Freq: Every day | 0 refills | Status: AC

## 2020-06-22 MED ORDER — INSULIN STARTER KIT- PEN NEEDLES (ENGLISH): 1.0000 | Freq: Once | 0 refills | Status: AC

## 2020-06-22 MED ORDER — INSULIN GLARGINE 100 UNIT/ML SOLOSTAR PEN: 10.0000 [IU] | PEN_INJECTOR | Freq: Every day | SUBCUTANEOUS | 0 refills | Status: DC

## 2020-06-22 MED ORDER — INSULIN STARTER KIT- PEN NEEDLES (ENGLISH)
1.0000 | Freq: Once | Status: DC
  Filled 2020-06-22: qty 1

## 2020-06-22 NOTE — Evaluation (Signed)
Physical Therapy Evaluation Patient Details Name: Donald Carpenter MRN: [id number] DOB: 1975-03-08 Today's Date: 06/22/2020   History of Present Illness  Pt is a 45 yo male that presented to ED for R foot wound, swelling, pain. s/p debridement 4/24. PMH of HTN, diabetes.  Clinical Impression  Pt A&Ox4, pleasant, family at bedside. Pt reported at baseline he is independent, works, no falls. The patient demonstrated bed mobility modI. Normal sitting balance noted, pt educated on donning/doffing off loading shoe as well as easier technique to assist with donning his shorts. Sit <> stand without AD with supervision. He was able to ambulate ~11ft with supervision, no LOB noted. Intermittently step to gait pattern noted due to off loading shoe, no physical assistance needed. He was also able to perform stair navigation with verbal cueing and supervision. Pt returned to room with all needs in reach. Pt demonstrated mild deficits from PLOF, current recommendation is to follow surgeons/MDs/podiatry recommendations for follow up therapies.     Follow Up Recommendations Follow surgeon's recommendation for DC plan and follow-up therapies    Equipment Recommendations  None recommended by PT    Recommendations for Other Services       Precautions / Restrictions Precautions Precautions: Fall Required Braces or Orthoses: Other Brace Other Brace: off loading shoe for RLE Restrictions Weight Bearing Restrictions: No      Mobility  Bed Mobility Overal bed mobility: Modified Independent                  Transfers Overall transfer level: Modified independent Equipment used: None                Ambulation/Gait Ambulation/Gait assistance: Supervision Gait Distance (Feet): 100 Feet Assistive device: None       General Gait Details: pt adjusting to off loading shoe, occasional step to gait pattern. no LOB noted, no physical assist needed  Stairs Stairs: Yes Stairs assistance:  Supervision Stair Management: One rail Right Number of Stairs: 4 General stair comments: instructed in step to pattern, no LOB or safety concerns at this time  Wheelchair Mobility    Modified Rankin (Stroke Patients Only)       Balance Overall balance assessment: Needs assistance Sitting-balance support: Feet supported Sitting balance-Leahy Scale: Normal       Standing balance-Leahy Scale: Good                               Pertinent Vitals/Pain Pain Assessment: No/denies pain    Home Living Family/patient expects to be discharged to:: Private residence Living Arrangements: Spouse/significant other Available Help at Discharge: Family Type of Home: House Home Access: Level entry     Home Layout: Two level Home Equipment: Cane - single point      Prior Function Level of Independence: Independent         Comments: works, no falls     Hand Dominance   Dominant Hand: Right    Extremity/Trunk Assessment   Upper Extremity Assessment Upper Extremity Assessment: Overall WFL for tasks assessed    Lower Extremity Assessment Lower Extremity Assessment: Overall WFL for tasks assessed;RLE deficits/detail RLE Deficits / Details: off loading shoe donned/doffed during session       Communication   Communication: No difficulties  Cognition Arousal/Alertness: Awake/alert Behavior During Therapy: WFL for tasks assessed/performed Overall Cognitive Status: Within Functional Limits for tasks assessed  General Comments      Exercises Other Exercises Other Exercises: Pt instructed in shoe use, donning/doffing   Assessment/Plan    PT Assessment Patient needs continued PT services  PT Problem List Pain;Decreased balance;Decreased activity tolerance;Decreased mobility       PT Treatment Interventions DME instruction;Balance training;Gait training;Neuromuscular re-education;Stair  training;Functional mobility training;Patient/family education;Therapeutic activities;Therapeutic exercise    PT Goals (Current goals can be found in the Care Plan section)  Acute Rehab PT Goals Patient Stated Goal: to go home PT Goal Formulation: With patient Time For Goal Achievement: 07/06/20 Potential to Achieve Goals: Good    Frequency Min 2X/week   Barriers to discharge        Co-evaluation               AM-PAC PT "6 Clicks" Mobility  Outcome Measure Help needed turning from your back to your side while in a flat bed without using bedrails?: None Help needed moving from lying on your back to sitting on the side of a flat bed without using bedrails?: None Help needed moving to and from a bed to a chair (including a wheelchair)?: None Help needed standing up from a chair using your arms (e.g., wheelchair or bedside chair)?: None Help needed to walk in hospital room?: None Help needed climbing 3-5 steps with a railing? : None 6 Click Score: 24    End of Session Equipment Utilized During Treatment: Gait belt Activity Tolerance: Patient tolerated treatment well Patient left: in bed;with family/visitor present;with bed alarm set Nurse Communication: Mobility status PT Visit Diagnosis: Other abnormalities of gait and mobility (R26.89);Pain Pain - Right/Left: Right Pain - part of body: Leg    Time: 7014-1030 PT Time Calculation (min) (ACUTE ONLY): 20 min   Charges:   PT Evaluation $PT Eval Low Complexity: 1 Low PT Treatments $Gait Training: 8-22 mins      Olga Coaster PT, DPT 1:03 PM,06/22/20

## 2020-06-22 NOTE — Progress Notes (Addendum)
Inpatient Diabetes Program Recommendations  AACE/ADA: New Consensus Statement on Inpatient Glycemic Control   Target Ranges:  Prepandial:   less than 140 mg/dL      Peak postprandial:   less than 180 mg/dL (1-2 hours)      Critically ill patients:  140 - 180 mg/dL   Results for Donald Carpenter, Donald Carpenter (MRN [id number]) as of 06/22/2020 11:43  Ref. Range 06/21/2020 18:38 06/21/2020 21:23 06/22/2020 07:47  Glucose-Capillary Latest Ref Range: 70 - 99 mg/dL 259 (H) 308 (H) 264 (H)  Results for Donald Carpenter, Donald Carpenter (MRN [id number]) as of 06/22/2020 11:43  Ref. Range 06/21/2020 19:42  Hemoglobin A1C Latest Ref Range: 4.8 - 5.6 % 14.7 (H)   Review of Glycemic Control  Diabetes history: DM2 Outpatient Diabetes medications: None; has been on Lantus and Novolog in past (4-5 years ago) Current orders for Inpatient glycemic control: Novolog 0-15 units TID with meals, Novolog 0-5 units QHS  Inpatient Diabetes Program Recommendations:    Insulin: Please consider ordering Lantus 15 units Q24H.  NOTE: Spoke with patient over the phone about diabetes and home regimen for diabetes control. Patient reports being followed by [facility] and he does not currently take any medications for DM as an outpatient. Patient states that he does not recall his last A1C or glucose when done at the [place]. Patient reports that he use to take Lantus and Novolog for DM control but was able to stop taking it 4 or 5 years ago after losing a lot of weight and controlling with just diet.  Patient has not been monitoring glucose at home over the past several years.  Discussed initial lab glucose 404 mg/dl on 06/21/20 and A1C 14.7% on 06/21/20 and explained that current A1C indicates an average glucose of 375 mg/dl over the past 2-3 months. Discussed glucose and A1C goals. Discussed importance of checking CBGs and maintaining good CBG control to prevent long-term and short-term complications. Explained how hyperglycemia leads common complications seen with  uncontrolled diabetes. Stressed to the patient the importance of improving glycemic control to prevent further complications from uncontrolled diabetes especially given current foot infection. Patient states that he would be willing to start taking insulin again as an outpatient and he would prefer to use insulin pens if prescribed insulin at discharge. Patient will also need Rx for glucose monitoring kit (#[id number]) at discharge.  Patient verbalized understanding of information discussed and reports no further questions at this time related to diabetes.  Addendum 06/22/20'@14' :58-Spoke with patient regarding insulin. Patient reported that he used Lantus and Novolog in the past (vials) and he would prefer to use insulin pens if discharged on insulin.  Educated patient on insulin pen use at home.  Reviewed all steps of insulin pen including attachment of needle, 2-unit air shot, dialing up dose, giving injection, removing needle, disposal of sharps, storage of unused insulin, disposal of insulin etc. Patient able to provide successful return demonstration. Patient reports he had been on Metformin and Glipizide in the past as well but he did not tolerate Metformin (GI side effects).   Patient reports that he has not been to the [facility] in [place] in "a long time" but he plans to get reconnected with them and follow up there. Patient is not sure how long it will take him to get a follow up visit. Encouraged patient to be sure to call and get an appointment as soon as possible for follow up, to take DM medications consistently, to monitor glucose as advised, and work toward getting DM  under better control especially given foot infection. Patient states that he plans to make changes and get back on track. Patient verbalized understanding of information discussed and states he has no further questions at this time. At time of discharge, please provide Rx for insulin pens, insulin pen needles (#[id number]), glucose monitoring kit  (#[id number]). May want to provide enough medication refills to last 2-3 months in case it takes a while for patient to get an appointment for follow up.  Thanks, Barnie Alderman, RN, MSN, CDE Diabetes Coordinator Inpatient Diabetes Program [phone] (Team Pager)

## 2020-06-22 NOTE — Progress Notes (Signed)
Subjective/Chief Complaint: Patient seen.  No significant pain in the foot at this point.   Objective: Vital signs in last 24 hours: Temp:  [98.1 F (36.7 C)-100.6 F (38.1 C)] 98.1 F (36.7 C) (04/25 1154) Pulse Rate:  [80-115] 80 (04/25 1154) Resp:  [16-20] 20 (04/25 1154) BP: (123-157)/(77-102) 147/86 (04/25 1154) SpO2:  [99 %-100 %] 100 % (04/25 1154) Weight:  [86.2 kg] 86.2 kg (04/24 1405) Last BM Date: 06/22/20  Intake/Output from previous day: 04/24 0701 - 04/25 0700 In: 1200 [I.V.:1200] Out: 500 [Urine:500] Intake/Output this shift: No intake/output data recorded.  Some moderate bleeding and mild drainage noted on the bandaging.  Upon removal the plantar ulceration on the right foot appears granular with some mild skin slough between the fourth and fifth and third toes.  Some mild edema but no significant cellulitis.  Lab Results:  Recent Labs    06/21/20 1408 06/22/20 0343  WBC 8.6 6.6  HGB 11.8* 10.6*  HCT 35.4* 31.7*  PLT 175 150   BMET Recent Labs    06/21/20 1408 06/22/20 0343  NA 130* 131*  K 4.0 3.5  CL 94* 98  CO2 26 25  GLUCOSE 404* 271*  BUN 18 17  CREATININE 1.12 0.99  CALCIUM 8.9 8.4*   PT/INR No results for input(s): LABPROT, INR in the last 72 hours. ABG No results for input(s): PHART, HCO3 in the last 72 hours.  Invalid input(s): PCO2, PO2  Studies/Results: DG Chest 2 View  Result Date: 06/21/2020 CLINICAL DATA:  Chest pain EXAM: CHEST - 2 VIEW COMPARISON:  04/15/2017 FINDINGS: The heart size and mediastinal contours are within normal limits. No focal airspace consolidation, pleural effusion, or pneumothorax. The visualized skeletal structures are unremarkable. IMPRESSION: No active cardiopulmonary disease. Electronically Signed   By: Duanne Guess D.O.   On: 06/21/2020 14:41   MR FOOT RIGHT WO CONTRAST  Result Date: 06/22/2020 CLINICAL DATA:  Diabetic foot ulcer with diffuse pain and swelling. EXAM: MRI OF THE RIGHT  FOREFOOT WITHOUT CONTRAST TECHNIQUE: Multiplanar, multisequence MR imaging of the right foot was performed. No intravenous contrast was administered. COMPARISON:  Radiographs 06/21/2020 FINDINGS: There is a small wound noted on the plantar aspect of the forefoot overlying the region of the fourth MTP joint. No abscess is identified. There is diffuse subcutaneous soft tissue swelling/edema consistent with cellulitis. There is also diffuse myofasciitis but no findings suspicious for pyomyositis. The bony structures are intact. No findings suspicious for septic arthritis or osteomyelitis. The visualized midfoot bony structures are intact. The major tendons and ligaments appear intact. IMPRESSION: 1. Small wound on the plantar aspect of the forefoot overlying the region of the fourth MTP joint. 2. Diffuse cellulitis and myofasciitis but no findings for soft tissue abscess or pyomyositis. 3. No MR findings for septic arthritis or osteomyelitis. Electronically Signed   By: Rudie Meyer M.D.   On: 06/22/2020 07:56   US Venous Img Lower Bilateral (DVT)  Result Date: 06/22/2020 CLINICAL DATA:  Swelling.  Right foot infection. EXAM: BILATERAL LOWER EXTREMITY VENOUS DOPPLER ULTRASOUND TECHNIQUE: Gray-scale sonography with compression, as well as color and duplex ultrasound, were performed to evaluate the deep venous system(s) from the level of the common femoral vein through the popliteal and proximal calf veins. COMPARISON:  10/19/2015 right lower extremity venous Doppler ultrasound FINDINGS: VENOUS Normal compressibility of the common femoral, superficial femoral, and popliteal veins, as well as the visualized calf veins. Visualized portions of profunda femoral vein and great saphenous vein unremarkable. No  filling defects to suggest DVT on grayscale or color Doppler imaging. Doppler waveforms show normal direction of venous flow, normal respiratory plasticity and response to augmentation. OTHER Mildly enlarged right  inguinal lymph nodes are nonspecific. They are most likely due to infectious etiology given history of right foot infection. Limitations: none IMPRESSION: 1. No lower extremity DVT. 2. Mildly enlarged right inguinal lymph nodes are likely due to inflammation. If swelling persists, repeat ultrasound evaluation should be performed. Electronically Signed   By: Acquanetta Belling M.D.   On: 06/22/2020 07:29   DG Foot Complete Right  Result Date: 06/21/2020 CLINICAL DATA:  Diabetic foot wound.  Possible osteomyelitis. EXAM: RIGHT FOOT COMPLETE - 3+ VIEW COMPARISON:  None. FINDINGS: A soft tissue wound is seen along the plantar aspect of the proximal 4th and 5th toes. There is no evidence of osteolysis or periostitis. No other bone lesions identified. No evidence of arthropathy. IMPRESSION: Soft tissue wound along the plantar aspect of the proximal 4th and 5th toes. No radiographic evidence of osteomyelitis. Electronically Signed   By: Danae Orleans M.D.   On: 06/21/2020 15:39    Anti-infectives: Anti-infectives (From admission, onward)   Start     Dose/Rate Route Frequency Ordered Stop   06/22/20 1000  cefTRIAXone (ROCEPHIN) 1 g in sodium chloride 0.9 % 100 mL IVPB        1 g 200 mL/hr over 30 Minutes Intravenous Every 24 hours 06/21/20 1659     06/21/20 1530  vancomycin (VANCOREADY) IVPB 2000 mg/400 mL        2,000 mg 200 mL/hr over 120 Minutes Intravenous  Once 06/21/20 1503 06/21/20 2150   06/21/20 1515  vancomycin (VANCOREADY) IVPB 1250 mg/250 mL  Status:  Discontinued        1,250 mg 166.7 mL/hr over 90 Minutes Intravenous  Once 06/21/20 1500 06/21/20 1503   06/21/20 1515  ceFEPIme (MAXIPIME) 2 g in sodium chloride 0.9 % 100 mL IVPB        2 g 200 mL/hr over 30 Minutes Intravenous  Once 06/21/20 1500 06/21/20 1553      Assessment/Plan: s/p * No surgery found * Assessment: Ulceration right forefoot, stable.   Plan: Betadine and a sterile bandage applied to the right forefoot with a gauze woven  between the toes.  Patient was instructed on daily wound care with antibiotic ointment and a similar gauze bandage.  Would recommend coverage with oral antibiotics for the next week to 10 days..  Follow-up in approximately 2 weeks for reevaluation.  LOS: 0 days    Ricci Barker 06/22/2020

## 2020-06-22 NOTE — Discharge Summary (Signed)
Physician Discharge Summary  Donald Carpenter UTM:[id number] DOB: 1975/12/24 DOA: 06/21/2020  PCP: [facility]  Admit date: 06/21/2020 Discharge date: 06/22/2020  Admitted From: Home Disposition: Home  Recommendations for Outpatient Follow-up:  1. Follow up with PCP in 1-2 weeks 2. Please obtain BMP/CBC in one week 3. Please follow up with surgery in 2 weeks:  Home Health: None Equipment/Devices: None  Discharge Condition: Stable CODE STATUS: Full Diet recommendation: Diabetic diet  Brief/Interim Summary: Donald Rollinsis a 45 y.o.malewith medical history significant forhypertension, non-insulin-dependent diabetes mellitus presents emergency department for chief concerns of right lower extremity leg wound. He endorses that the right foot on, uploaded to media, started as a callus and he kept picking at it. He denies pus. He states that he has been trying to self medicate for a year however it has gotten worse and in the last week he has had increasing swelling in the right lower extremity. He endorses feeling nausea, chills, generalized malaise and right lower extremity swelling that has worsened on Friday evening. He states that he then went to sleep however that it did not improve. He attempted going to work however had to leave work because he felt so weak. This prompted him to present to the emergency department for further evaluation. He denies any vomiting, chest pain, shortness of breath, dysuria. He endorses subjective fever however did not take his temperature at home.  Hospitalist requested for admission.  Sepsis secondary to right foot infection, osteo ruled out, POA - Febrile, tachycardic with obvious source of infection -Podiatry following - debridement 4/24 - MRI does not show osteomyelitis - discharge on augmentin x 10 days  Profoundly uncontrolled DM2, unspecified duration Recent Labs       Lab Results  Component Value Date   HGBA1C 14.7 (H)  06/21/2020    - Patient states that he previously was on insulin, however several years ago he went on a diet and exercise regimen and he started exercising every day and changing his diet, he lost 200 pounds and did not require insulin any longer -Transition to Lantus 10 units daily, ACH S Accu-Chek as discussed    Discharge Diagnoses:  Active Problems:   Right foot infection   Hyperglycemia   Hyperosmolar hyponatremia    Discharge Instructions  Discharge Instructions    Call MD for:  redness, tenderness, or signs of infection (pain, swelling, redness, odor or green/yellow discharge around incision site)   Complete by: As directed    Call MD for:  severe uncontrolled pain   Complete by: As directed    Call MD for:  temperature >100.4   Complete by: As directed    Diet - low sodium heart healthy   Complete by: As directed    Discharge wound care:   Complete by: As directed    Per surgery: Betadine and a sterile bandage applied to the right forefoot with a gauze woven between the toes.  Patient was instructed on daily wound care with antibiotic ointment and a similar gauze bandage.   Increase activity slowly   Complete by: As directed      Allergies as of 06/22/2020   No Known Allergies     Medication List    STOP taking these medications   benzonatate 200 MG capsule Commonly known as: TESSALON   cephALEXin 500 MG capsule Commonly known as: KEFLEX   HYDROcodone-homatropine 5-1.5 MG/5ML syrup Commonly known as: HYCODAN   levofloxacin 500 MG tablet Commonly known as: LEVAQUIN  TAKE these medications   amoxicillin-clavulanate 875-125 MG tablet Commonly known as: Augmentin Take 1 tablet by mouth 2 (two) times daily for 10 days.   blood glucose meter kit and supplies Kit Dispense based on patient and insurance preference. Use up to four times daily as directed.   HYDROcodone-acetaminophen 5-325 MG tablet Commonly known as: NORCO/VICODIN Take 1 tablet by  mouth every 4 (four) hours as needed for moderate pain.   insulin glargine 100 UNIT/ML Solostar Pen Commonly known as: LANTUS Inject 10 Units into the skin daily.   insulin starter kit- pen needles Misc 1 kit by Other route once for 1 dose.   meloxicam 15 MG tablet Commonly known as: MOBIC Take 1 tablet (15 mg total) by mouth daily.   Pen Needles 32G X 4 MM Misc 1 each by Does not apply route daily.            Discharge Care Instructions  (From admission, onward)         Start     Ordered   06/22/20 0000  Discharge wound care:       Comments: Per surgery: Betadine and a sterile bandage applied to the right forefoot with a gauze woven between the toes.  Patient was instructed on daily wound care with antibiotic ointment and a similar gauze bandage.   06/22/20 1437          No Known Allergies  Consultations:  Surgery   Procedures/Studies: DG Chest 2 View  Result Date: 06/21/2020 CLINICAL DATA:  Chest pain EXAM: CHEST - 2 VIEW COMPARISON:  04/15/2017 FINDINGS: The heart size and mediastinal contours are within normal limits. No focal airspace consolidation, pleural effusion, or pneumothorax. The visualized skeletal structures are unremarkable. IMPRESSION: No active cardiopulmonary disease. Electronically Signed   By: Davina Poke D.O.   On: 06/21/2020 14:41   MR FOOT RIGHT WO CONTRAST  Result Date: 06/22/2020 CLINICAL DATA:  Diabetic foot ulcer with diffuse pain and swelling. EXAM: MRI OF THE RIGHT FOREFOOT WITHOUT CONTRAST TECHNIQUE: Multiplanar, multisequence MR imaging of the right foot was performed. No intravenous contrast was administered. COMPARISON:  Radiographs 06/21/2020 FINDINGS: There is a small wound noted on the plantar aspect of the forefoot overlying the region of the fourth MTP joint. No abscess is identified. There is diffuse subcutaneous soft tissue swelling/edema consistent with cellulitis. There is also diffuse myofasciitis but no findings  suspicious for pyomyositis. The bony structures are intact. No findings suspicious for septic arthritis or osteomyelitis. The visualized midfoot bony structures are intact. The major tendons and ligaments appear intact. IMPRESSION: 1. Small wound on the plantar aspect of the forefoot overlying the region of the fourth MTP joint. 2. Diffuse cellulitis and myofasciitis but no findings for soft tissue abscess or pyomyositis. 3. No MR findings for septic arthritis or osteomyelitis. Electronically Signed   By: Marijo Sanes M.D.   On: 06/22/2020 07:56   US Venous Img Lower Bilateral (DVT)  Result Date: 06/22/2020 CLINICAL DATA:  Swelling.  Right foot infection. EXAM: BILATERAL LOWER EXTREMITY VENOUS DOPPLER ULTRASOUND TECHNIQUE: Gray-scale sonography with compression, as well as color and duplex ultrasound, were performed to evaluate the deep venous system(s) from the level of the common femoral vein through the popliteal and proximal calf veins. COMPARISON:  10/19/2015 right lower extremity venous Doppler ultrasound FINDINGS: VENOUS Normal compressibility of the common femoral, superficial femoral, and popliteal veins, as well as the visualized calf veins. Visualized portions of profunda femoral vein and great saphenous vein unremarkable. No  filling defects to suggest DVT on grayscale or color Doppler imaging. Doppler waveforms show normal direction of venous flow, normal respiratory plasticity and response to augmentation. OTHER Mildly enlarged right inguinal lymph nodes are nonspecific. They are most likely due to infectious etiology given history of right foot infection. Limitations: none IMPRESSION: 1. No lower extremity DVT. 2. Mildly enlarged right inguinal lymph nodes are likely due to inflammation. If swelling persists, repeat ultrasound evaluation should be performed. Electronically Signed   By: Miachel Roux M.D.   On: 06/22/2020 07:29   DG Foot Complete Right  Result Date: 06/21/2020 CLINICAL DATA:   Diabetic foot wound.  Possible osteomyelitis. EXAM: RIGHT FOOT COMPLETE - 3+ VIEW COMPARISON:  None. FINDINGS: A soft tissue wound is seen along the plantar aspect of the proximal 4th and 5th toes. There is no evidence of osteolysis or periostitis. No other bone lesions identified. No evidence of arthropathy. IMPRESSION: Soft tissue wound along the plantar aspect of the proximal 4th and 5th toes. No radiographic evidence of osteomyelitis. Electronically Signed   By: Marlaine Hind M.D.   On: 06/21/2020 15:39     Subjective: No acute issues or events overnight tolerated procedure well denies nausea vomiting diarrhea constipation headache fevers or chills.   Discharge Exam: Vitals:   06/22/20 0745 06/22/20 1154  BP: 130/86 (!) 147/86  Pulse: 82 80  Resp: 20 20  Temp: 98.8 F (37.1 C) 98.1 F (36.7 C)  SpO2: 100% 100%   Vitals:   06/21/20 2034 06/22/20 0458 06/22/20 0745 06/22/20 1154  BP: 139/77 128/81 130/86 (!) 147/86  Pulse: 98 89 82 80  Resp: _0 Temp: 99.9 F (37.7 C) 98.6 F (37 C) 98.8 F (37.1 C) 98.1 F (36.7 C)  TempSrc: Oral Oral    SpO2: 100% 99% 100% 100%  Weight:      Height:        General: Pt is alert, awake, not in acute distress Cardiovascular: RRR, S1/S2 +, no rubs, no gallops Respiratory: CTA bilaterally, no wheezing, no rhonchi Abdominal: Soft, NT, ND, bowel sounds + Extremities: no edema, no cyanosis    The results of significant diagnostics from this hospitalization (including imaging, microbiology, ancillary and laboratory) are listed below for reference.     Microbiology: Recent Results (from the past 240 hour(s))  Culture, blood (single)     Status: None (Preliminary result)   Collection Time: 06/21/20  3:45 PM   Specimen: BLOOD  Result Value Ref Range Status   Specimen Description BLOOD LEFT WRIST  Final   Special Requests   Final    BOTTLES DRAWN AEROBIC AND ANAEROBIC Blood Culture adequate volume   Culture   Final    NO GROWTH <  24 HOURS Performed at [facility], [address], [place], [place] [address]    Report Status PENDING  Incomplete  Resp Panel by RT-PCR (Flu A&B, Covid) Nasopharyngeal Swab     Status: None   Collection Time: 06/21/20  3:47 PM   Specimen: Nasopharyngeal Swab; Nasopharyngeal(NP) swabs in vial transport medium  Result Value Ref Range Status   SARS Coronavirus 2 by RT PCR NEGATIVE NEGATIVE Final    Comment: (NOTE) SARS-CoV-2 target nucleic acids are NOT DETECTED.  The SARS-CoV-2 RNA is generally detectable in upper respiratory specimens during the acute phase of infection. The lowest concentration of SARS-CoV-2 viral copies this assay can detect is 138 copies/mL. A negative result does not preclude SARS-Cov-2 infection and should not be used  as the sole basis for treatment or other patient management decisions. A negative result may occur with  improper specimen collection/handling, submission of specimen other than nasopharyngeal swab, presence of viral mutation(s) within the areas targeted by this assay, and inadequate number of viral copies(<138 copies/mL). A negative result must be combined with clinical observations, patient history, and epidemiological information. The expected result is Negative.  Fact Sheet for Patients:  EntrepreneurPulse.com.au  Fact Sheet for Healthcare Providers:  IncredibleEmployment.be  This test is no t yet approved or cleared by the [place] FDA and  has been authorized for detection and/or diagnosis of SARS-CoV-2 by FDA under an Emergency Use Authorization (EUA). This EUA will remain  in effect (meaning this test can be used) for the duration of the COVID-19 declaration under Section 564(b)(1) of the Act, 21 U.S.C.section 360bbb-3(b)(1), unless the authorization is terminated  or revoked sooner.       Influenza A by PCR NEGATIVE NEGATIVE Final   Influenza B by PCR NEGATIVE NEGATIVE Final     Comment: (NOTE) The Xpert Xpress SARS-CoV-2/FLU/RSV plus assay is intended as an aid in the diagnosis of influenza from Nasopharyngeal swab specimens and should not be used as a sole basis for treatment. Nasal washings and aspirates are unacceptable for Xpert Xpress SARS-CoV-2/FLU/RSV testing.  Fact Sheet for Patients: EntrepreneurPulse.com.au  Fact Sheet for Healthcare Providers: IncredibleEmployment.be  This test is not yet approved or cleared by the [place] FDA and has been authorized for detection and/or diagnosis of SARS-CoV-2 by FDA under an Emergency Use Authorization (EUA). This EUA will remain in effect (meaning this test can be used) for the duration of the COVID-19 declaration under Section 564(b)(1) of the Act, 21 U.S.C. section 360bbb-3(b)(1), unless the authorization is terminated or revoked.  Performed at [facility], [address] [place], [place] [address]   Aerobic/Anaerobic Culture w Gram Stain (surgical/deep wound)     Status: None (Preliminary result)   Collection Time: 06/21/20  7:12 PM   Specimen: Foot; Abscess  Result Value Ref Range Status   Specimen Description   Final    FOOT RIGHT Performed at [facility], [address] [place], [place] [address]    Special Requests   Final    NONE Performed at [facility], [address] [place], [place] [address]    Gram Stain   Final    FEW WBC PRESENT, PREDOMINANTLY PMN ABUNDANT GRAM POSITIVE COCCI IN PAIRS IN CLUSTERS ABUNDANT GRAM POSITIVE RODS MODERATE GRAM NEGATIVE RODS Performed at [facility], [place] [address] [place], [place] [address]    Culture PENDING  Incomplete   Report Status PENDING  Incomplete  MRSA PCR Screening     Status: None   Collection Time: 06/21/20 10:50 PM   Specimen: Nasal Mucosa; Nasopharyngeal  Result Value Ref Range Status   MRSA by PCR NEGATIVE NEGATIVE Final    Comment:        The  GeneXpert MRSA Assay (FDA approved for NASAL specimens only), is one component of a comprehensive MRSA colonization surveillance program. It is not intended to diagnose MRSA infection nor to guide or monitor treatment for MRSA infections. Performed at [facility], [address] [place], [place] [address]      Labs: BNP (last 3 results) No results for input(s): BNP in the last 8760 hours. Basic Metabolic Panel: Recent Labs  Lab 06/21/20 1408 06/22/20 0343  NA 130* 131*  K 4.0 3.5  CL 94* 98  CO2 26 25  GLUCOSE 404* 271*  BUN 18 17  CREATININE 1.12 0.99  CALCIUM 8.9 8.4*   Liver Function Tests: Recent Labs  Lab 06/22/20 0343  AST 13*  ALT 13  ALKPHOS 42  BILITOT 0.7  PROT 6.0*  ALBUMIN 3.3*   No results for input(s): LIPASE, AMYLASE in the last 168 hours. No results for input(s): AMMONIA in the last 168 hours. CBC: Recent Labs  Lab 06/21/20 1408 06/22/20 0343  WBC 8.6 6.6  HGB 11.8* 10.6*  HCT 35.4* 31.7*  MCV 81.8 82.1  PLT 175 150   Cardiac Enzymes: No results for input(s): CKTOTAL, CKMB, CKMBINDEX, TROPONINI in the last 168 hours. BNP: Invalid input(s): POCBNP CBG: Recent Labs  Lab 06/21/20 1838 06/21/20 2123 06/22/20 0747 06/22/20 1233  GLUCAP 259* 308* 264* 171*   D-Dimer No results for input(s): DDIMER in the last 72 hours. Hgb A1c Recent Labs    06/21/20 1942  HGBA1C 14.7*   Lipid Profile No results for input(s): CHOL, HDL, LDLCALC, TRIG, CHOLHDL, LDLDIRECT in the last 72 hours. Thyroid function studies No results for input(s): TSH, T4TOTAL, T3FREE, THYROIDAB in the last 72 hours.  Invalid input(s): FREET3 Anemia work up No results for input(s): VITAMINB12, FOLATE, FERRITIN, TIBC, IRON, RETICCTPCT in the last 72 hours. Urinalysis    Component Value Date/Time   COLORURINE Yellow 03/29/2011 0638   APPEARANCEUR Clear 03/29/2011 0638   LABSPEC 1.032 03/29/2011 0638   PHURINE 5.0 03/29/2011 0638   GLUCOSEU >=500  03/29/2011 0638   HGBUR 1+ 03/29/2011 0638   BILIRUBINUR Negative 03/29/2011 0638   KETONESUR 2+ 03/29/2011 0638   PROTEINUR 100 mg/dL 03/29/2011 0638   NITRITE Negative 03/29/2011 0638   LEUKOCYTESUR Negative 03/29/2011 0638   Sepsis Labs Invalid input(s): PROCALCITONIN,  WBC,  LACTICIDVEN Microbiology Recent Results (from the past 240 hour(s))  Culture, blood (single)     Status: None (Preliminary result)   Collection Time: 06/21/20  3:45 PM   Specimen: BLOOD  Result Value Ref Range Status   Specimen Description BLOOD LEFT WRIST  Final   Special Requests   Final    BOTTLES DRAWN AEROBIC AND ANAEROBIC Blood Culture adequate volume   Culture   Final    NO GROWTH < 24 HOURS Performed at [facility], [address], [place], [place] [address]    Report Status PENDING  Incomplete  Resp Panel by RT-PCR (Flu A&B, Covid) Nasopharyngeal Swab     Status: None   Collection Time: 06/21/20  3:47 PM   Specimen: Nasopharyngeal Swab; Nasopharyngeal(NP) swabs in vial transport medium  Result Value Ref Range Status   SARS Coronavirus 2 by RT PCR NEGATIVE NEGATIVE Final    Comment: (NOTE) SARS-CoV-2 target nucleic acids are NOT DETECTED.  The SARS-CoV-2 RNA is generally detectable in upper respiratory specimens during the acute phase of infection. The lowest concentration of SARS-CoV-2 viral copies this assay can detect is 138 copies/mL. A negative result does not preclude SARS-Cov-2 infection and should not be used as the sole basis for treatment or other patient management decisions. A negative result may occur with  improper specimen collection/handling, submission of specimen other than nasopharyngeal swab, presence of viral mutation(s) within the areas targeted by this assay, and inadequate number of viral copies(<138 copies/mL). A negative result must be combined with clinical observations, patient history, and epidemiological information. The expected result is  Negative.  Fact Sheet for Patients:  EntrepreneurPulse.com.au  Fact Sheet for Healthcare Providers:  IncredibleEmployment.be  This test  is no t yet approved or cleared by the [place] and  has been authorized for detection and/or diagnosis of SARS-CoV-2 by FDA under an Emergency Use Authorization (EUA). This EUA will remain  in effect (meaning this test can be used) for the duration of the COVID-19 declaration under Section 564(b)(1) of the Act, 21 U.S.C.section 360bbb-3(b)(1), unless the authorization is terminated  or revoked sooner.       Influenza A by PCR NEGATIVE NEGATIVE Final   Influenza B by PCR NEGATIVE NEGATIVE Final    Comment: (NOTE) The Xpert Xpress SARS-CoV-2/FLU/RSV plus assay is intended as an aid in the diagnosis of influenza from Nasopharyngeal swab specimens and should not be used as a sole basis for treatment. Nasal washings and aspirates are unacceptable for Xpert Xpress SARS-CoV-2/FLU/RSV testing.  Fact Sheet for Patients: EntrepreneurPulse.com.au  Fact Sheet for Healthcare Providers: IncredibleEmployment.be  This test is not yet approved or cleared by the [place] FDA and has been authorized for detection and/or diagnosis of SARS-CoV-2 by FDA under an Emergency Use Authorization (EUA). This EUA will remain in effect (meaning this test can be used) for the duration of the COVID-19 declaration under Section 564(b)(1) of the Act, 21 U.S.C. section 360bbb-3(b)(1), unless the authorization is terminated or revoked.  Performed at [facility], [address] [place], [place] [address]   Aerobic/Anaerobic Culture w Gram Stain (surgical/deep wound)     Status: None (Preliminary result)   Collection Time: 06/21/20  7:12 PM   Specimen: Foot; Abscess  Result Value Ref Range Status   Specimen Description   Final    FOOT RIGHT Performed at [facility],  [address] [place], [place] [address]    Special Requests   Final    NONE Performed at [facility], [address] [place], [place] [address]    Gram Stain   Final    FEW WBC PRESENT, PREDOMINANTLY PMN ABUNDANT GRAM POSITIVE COCCI IN PAIRS IN CLUSTERS ABUNDANT GRAM POSITIVE RODS MODERATE GRAM NEGATIVE RODS Performed at [facility], [place] [address] [place], [place] [address]    Culture PENDING  Incomplete   Report Status PENDING  Incomplete  MRSA PCR Screening     Status: None   Collection Time: 06/21/20 10:50 PM   Specimen: Nasal Mucosa; Nasopharyngeal  Result Value Ref Range Status   MRSA by PCR NEGATIVE NEGATIVE Final    Comment:        The GeneXpert MRSA Assay (FDA approved for NASAL specimens only), is one component of a comprehensive MRSA colonization surveillance program. It is not intended to diagnose MRSA infection nor to guide or monitor treatment for MRSA infections. Performed at [facility], [address], [place], [place] [address]      Time coordinating discharge: Over 30 minutes  SIGNED:   Little Ishikawa, DO [facility] 06/22/2020, 3:35 PM Pager   If 7PM-7AM, please contact night-coverage [email or web address]

## 2020-06-22 NOTE — Progress Notes (Deleted)
PROGRESS NOTE    Donald Carpenter  EQA:[id number] DOB: October 27, 1975 DOA: 06/21/2020 PCP: [facility]   Brief Narrative:  Donald Carpenter is a 45 y.o. male with medical history significant for hypertension, non-insulin-dependent diabetes mellitus presents emergency department for chief concerns of right lower extremity leg wound. He endorses that the right foot on, uploaded to media, started as a callus and he kept picking at it.  He denies pus.  He states that he has been trying to self medicate for a year however it has gotten worse and in the last week he has had increasing swelling in the right lower extremity. He endorses feeling nausea, chills, generalized malaise and right lower extremity swelling that has worsened on Friday evening.  He states that he then went to sleep however that it did not improve.  He attempted going to work however had to leave work because he felt so weak.  This prompted him to present to the emergency department for further evaluation.  He denies any vomiting, chest pain, shortness of breath, dysuria. He endorses subjective fever however did not take his temperature at home.  Hospitalist requested for admission.   Assessment & Plan:   Active Problems:   Right foot infection   Hyperglycemia   Hyperosmolar hyponatremia  Sepsis secondary to right foot infection, rule out osteomyelitis, POA - Febrile, tachycardic with obvious source of infection - Podiatry following - debridement 4/24 - MRI does not show osteomyelitis but ongoing cellulitis and mild fasciitis, will discuss with podiatry need for further intervention/surgery versus supportive care and antibiotics -Continue to follow cultures, will de-escalate as appropriate - Ceftriaxone 1 g IV, every 24 hours, per cellulitis order set - Pain control: Acetaminophen for mild pain, ketorolac every 8 hours as needed for moderate pain  Hypovolemic hyponatremis - Continue LR perioperatively given poor PO  intake  Profoundly uncontrolled DM2, unspecified duration Lab Results  Component Value Date   HGBA1C 14.7 (H) 06/21/2020  - Patient states that he previously was on insulin, however several years ago he went on a diet and exercise regimen and he started exercising every day and changing his diet, he lost 200 pounds and did not require insulin any longer - Will need to resume insulin at discharge given his elevated A1C - unclear if he has endocrine follow up - Insulin sliding scale with at bedtime coverage, discussed with my decision and they agree  DVT prophylaxis: Heparin 5000 units every 8 hours, TED hose Code Status: Full code Family Communication: At bedside  Status is: Inpatient  Dispo: The patient is from: Home              Anticipated d/c is to: Home              Anticipated d/c date is: 24 to 48 hours              Patient currently not medically stable for discharge given ongoing need for possible intervention imaging IV antibiotics  Consultants:   Podiatry  Procedures:   Debridement of R foot  Antimicrobials:  Ceftriaxone   Subjective: No acute issues or events overnight tolerated procedure well denies nausea vomiting diarrhea constipation headache fevers or chills.  Objective: Vitals:   06/21/20 1730 06/21/20 1818 06/21/20 2034 06/22/20 0458  BP: 123/89 (!) 155/85 139/77 128/81  Pulse: (!) 104 95 98 89  Resp:  18 18 18   Temp:  (!) 100.6 F (38.1 C) 99.9 F (37.7 C) 98.6 F (37 C)  TempSrc:   Oral Oral  SpO2: 99% 100% 100% 99%  Weight:      Height:        Intake/Output Summary (Last 24 hours) at 06/22/2020 0654 Last data filed at 06/22/2020 0600 Gross per 24 hour  Intake 1200 ml  Output 500 ml  Net 700 ml   Filed Weights   06/21/20 1405  Weight: 86.2 kg    Examination:  General:  Pleasantly resting in bed, No acute distress. HEENT:  Normocephalic atraumatic.  Sclerae nonicteric, noninjected.  Extraocular movements intact bilaterally. Neck:   Without mass or deformity.  Trachea is midline. Lungs:  Clear to auscultate bilaterally without rhonchi, wheeze, or rales. Heart:  Regular rate and rhythm.  Without murmurs, rubs, or gallops. Abdomen:  Soft, nontender, nondistended.  Without guarding or rebound. Extremities: Right foot bandage clean dry intact. Vascular:  Dorsalis pedis and posterior tibial pulses palpable bilaterally. Skin:  Warm and dry, no erythema, no ulcerations.    Data Reviewed: I have personally reviewed following labs and imaging studies  CBC: Recent Labs  Lab 06/21/20 1408 06/22/20 0343  WBC 8.6 6.6  HGB 11.8* 10.6*  HCT 35.4* 31.7*  MCV 81.8 82.1  PLT 175 150   Basic Metabolic Panel: Recent Labs  Lab 06/21/20 1408 06/22/20 0343  NA 130* 131*  K 4.0 3.5  CL 94* 98  CO2 26 25  GLUCOSE 404* 271*  BUN 18 17  CREATININE 1.12 0.99  CALCIUM 8.9 8.4*   GFR: Estimated Creatinine Clearance: 112.5 mL/min (by C-G formula based on SCr of 0.99 mg/dL). Liver Function Tests: Recent Labs  Lab 06/22/20 0343  AST 13*  ALT 13  ALKPHOS 42  BILITOT 0.7  PROT 6.0*  ALBUMIN 3.3*   No results for input(s): LIPASE, AMYLASE in the last 168 hours. No results for input(s): AMMONIA in the last 168 hours. Coagulation Profile: No results for input(s): INR, PROTIME in the last 168 hours. Cardiac Enzymes: No results for input(s): CKTOTAL, CKMB, CKMBINDEX, TROPONINI in the last 168 hours. BNP (last 3 results) No results for input(s): PROBNP in the last 8760 hours. HbA1C: Recent Labs    06/21/20 1942  HGBA1C 14.7*   CBG: Recent Labs  Lab 06/21/20 1838 06/21/20 2123  GLUCAP 259* 308*   Lipid Profile: No results for input(s): CHOL, HDL, LDLCALC, TRIG, CHOLHDL, LDLDIRECT in the last 72 hours. Thyroid Function Tests: No results for input(s): TSH, T4TOTAL, FREET4, T3FREE, THYROIDAB in the last 72 hours. Anemia Panel: No results for input(s): VITAMINB12, FOLATE, FERRITIN, TIBC, IRON, RETICCTPCT in the  last 72 hours. Sepsis Labs: Recent Labs  Lab 06/21/20 1545 06/21/20 1942  LATICACIDVEN 1.1 0.8    Recent Results (from the past 240 hour(s))  Culture, blood (single)     Status: None (Preliminary result)   Collection Time: 06/21/20  3:45 PM   Specimen: BLOOD  Result Value Ref Range Status   Specimen Description BLOOD LEFT WRIST  Final   Special Requests   Final    BOTTLES DRAWN AEROBIC AND ANAEROBIC Blood Culture adequate volume   Culture   Final    NO GROWTH < 24 HOURS Performed at [facility], [address], [place], [place] [address]    Report Status PENDING  Incomplete  Resp Panel by RT-PCR (Flu A&B, Covid) Nasopharyngeal Swab     Status: None   Collection Time: 06/21/20  3:47 PM   Specimen: Nasopharyngeal Swab; Nasopharyngeal(NP) swabs in vial transport medium  Result Value Ref Range Status  SARS Coronavirus 2 by RT PCR NEGATIVE NEGATIVE Final    Comment: (NOTE) SARS-CoV-2 target nucleic acids are NOT DETECTED.  The SARS-CoV-2 RNA is generally detectable in upper respiratory specimens during the acute phase of infection. The lowest concentration of SARS-CoV-2 viral copies this assay can detect is 138 copies/mL. A negative result does not preclude SARS-Cov-2 infection and should not be used as the sole basis for treatment or other patient management decisions. A negative result may occur with  improper specimen collection/handling, submission of specimen other than nasopharyngeal swab, presence of viral mutation(s) within the areas targeted by this assay, and inadequate number of viral copies(<138 copies/mL). A negative result must be combined with clinical observations, patient history, and epidemiological information. The expected result is Negative.  Fact Sheet for Patients:  BloggerCourse.com  Fact Sheet for Healthcare Providers:  SeriousBroker.it  This test is no t yet approved or cleared by the  [place] FDA and  has been authorized for detection and/or diagnosis of SARS-CoV-2 by FDA under an Emergency Use Authorization (EUA). This EUA will remain  in effect (meaning this test can be used) for the duration of the COVID-19 declaration under Section 564(b)(1) of the Act, 21 U.S.C.section 360bbb-3(b)(1), unless the authorization is terminated  or revoked sooner.       Influenza A by PCR NEGATIVE NEGATIVE Final   Influenza B by PCR NEGATIVE NEGATIVE Final    Comment: (NOTE) The Xpert Xpress SARS-CoV-2/FLU/RSV plus assay is intended as an aid in the diagnosis of influenza from Nasopharyngeal swab specimens and should not be used as a sole basis for treatment. Nasal washings and aspirates are unacceptable for Xpert Xpress SARS-CoV-2/FLU/RSV testing.  Fact Sheet for Patients: BloggerCourse.com  Fact Sheet for Healthcare Providers: SeriousBroker.it  This test is not yet approved or cleared by the [place] FDA and has been authorized for detection and/or diagnosis of SARS-CoV-2 by FDA under an Emergency Use Authorization (EUA). This EUA will remain in effect (meaning this test can be used) for the duration of the COVID-19 declaration under Section 564(b)(1) of the Act, 21 U.S.C. section 360bbb-3(b)(1), unless the authorization is terminated or revoked.  Performed at [facility], [address] [place], [place] [address]   MRSA PCR Screening     Status: None   Collection Time: 06/21/20 10:50 PM   Specimen: Nasal Mucosa; Nasopharyngeal  Result Value Ref Range Status   MRSA by PCR NEGATIVE NEGATIVE Final    Comment:        The GeneXpert MRSA Assay (FDA approved for NASAL specimens only), is one component of a comprehensive MRSA colonization surveillance program. It is not intended to diagnose MRSA infection nor to guide or monitor treatment for MRSA infections. Performed at [facility],  [address] [place], [place] [address]          Radiology Studies: DG Chest 2 View  Result Date: 06/21/2020 CLINICAL DATA:  Chest pain EXAM: CHEST - 2 VIEW COMPARISON:  04/15/2017 FINDINGS: The heart size and mediastinal contours are within normal limits. No focal airspace consolidation, pleural effusion, or pneumothorax. The visualized skeletal structures are unremarkable. IMPRESSION: No active cardiopulmonary disease. Electronically Signed   By: Duanne Guess D.O.   On: 06/21/2020 14:41   DG Foot Complete Right  Result Date: 06/21/2020 CLINICAL DATA:  Diabetic foot wound.  Possible osteomyelitis. EXAM: RIGHT FOOT COMPLETE - 3+ VIEW COMPARISON:  None. FINDINGS: A soft tissue wound is seen along the plantar aspect of the proximal 4th  and 5th toes. There is no evidence of osteolysis or periostitis. No other bone lesions identified. No evidence of arthropathy. IMPRESSION: Soft tissue wound along the plantar aspect of the proximal 4th and 5th toes. No radiographic evidence of osteomyelitis. Electronically Signed   By: Danae OrleansJohn A Stahl M.D.   On: 06/21/2020 15:39   Scheduled Meds: . heparin injection (subcutaneous)  5,000 Units Subcutaneous Q8H  . insulin aspart  0-15 Units Subcutaneous TID WC  . insulin aspart  0-5 Units Subcutaneous QHS   Continuous Infusions: . cefTRIAXone (ROCEPHIN)  IV    . lactated ringers 150 mL/hr at 06/22/20 0530     LOS: 0 days   Time spent: 40min  Azucena FallenWilliam C Shun Pletz, DO [facility]  If 7PM-7AM, please contact night-coverage [email or web address]  06/22/2020, 6:54 AM

## 2020-06-26 LAB — CULTURE, BLOOD (SINGLE)
Culture: NO GROWTH
Special Requests: ADEQUATE

## 2020-06-27 LAB — AEROBIC/ANAEROBIC CULTURE W GRAM STAIN (SURGICAL/DEEP WOUND): Culture: NORMAL

## 2021-01-25 ENCOUNTER — Other Ambulatory Visit: Payer: Self-pay

## 2021-01-25 ENCOUNTER — Emergency Department
Admission: EM | Admit: 2021-01-25 | Discharge: 2021-01-25 | Disposition: A | Discharge status: Home / Self Care | Payer: No Typology Code available for payment source | Attending: Emergency Medicine | Admitting: Emergency Medicine

## 2021-01-25 ENCOUNTER — Emergency Department: Payer: No Typology Code available for payment source

## 2021-01-25 DIAGNOSIS — F17210 Nicotine dependence, cigarettes, uncomplicated: Secondary | ICD-10-CM | POA: Insufficient documentation

## 2021-01-25 DIAGNOSIS — Z20822 Contact with and (suspected) exposure to covid-19: Secondary | ICD-10-CM | POA: Insufficient documentation

## 2021-01-25 DIAGNOSIS — R059 Cough, unspecified: Secondary | ICD-10-CM | POA: Diagnosis present

## 2021-01-25 DIAGNOSIS — J101 Influenza due to other identified influenza virus with other respiratory manifestations: Secondary | ICD-10-CM | POA: Diagnosis not present

## 2021-01-25 DIAGNOSIS — E119 Type 2 diabetes mellitus without complications: Secondary | ICD-10-CM | POA: Diagnosis not present

## 2021-01-25 DIAGNOSIS — Z794 Long term (current) use of insulin: Secondary | ICD-10-CM | POA: Diagnosis not present

## 2021-01-25 LAB — RESP PANEL BY RT-PCR (FLU A&B, COVID) ARPGX2
Influenza A by PCR: POSITIVE — AB
Influenza B by PCR: NEGATIVE
SARS Coronavirus 2 by RT PCR: NEGATIVE

## 2021-01-25 IMAGING — CR DG CHEST 2V
1 series · 2 of 2 positions shown · non-contrast
Comparison: Two-view chest x-ray [DATE]

CLINICAL DATA: Fever and cough. Patient tested positive for
influenza.

EXAM:
CHEST - 2 VIEW

[Series 1: dg chest 2 view · 0.14mm/px · 2 of 2 slices shown]
[im 1/2]
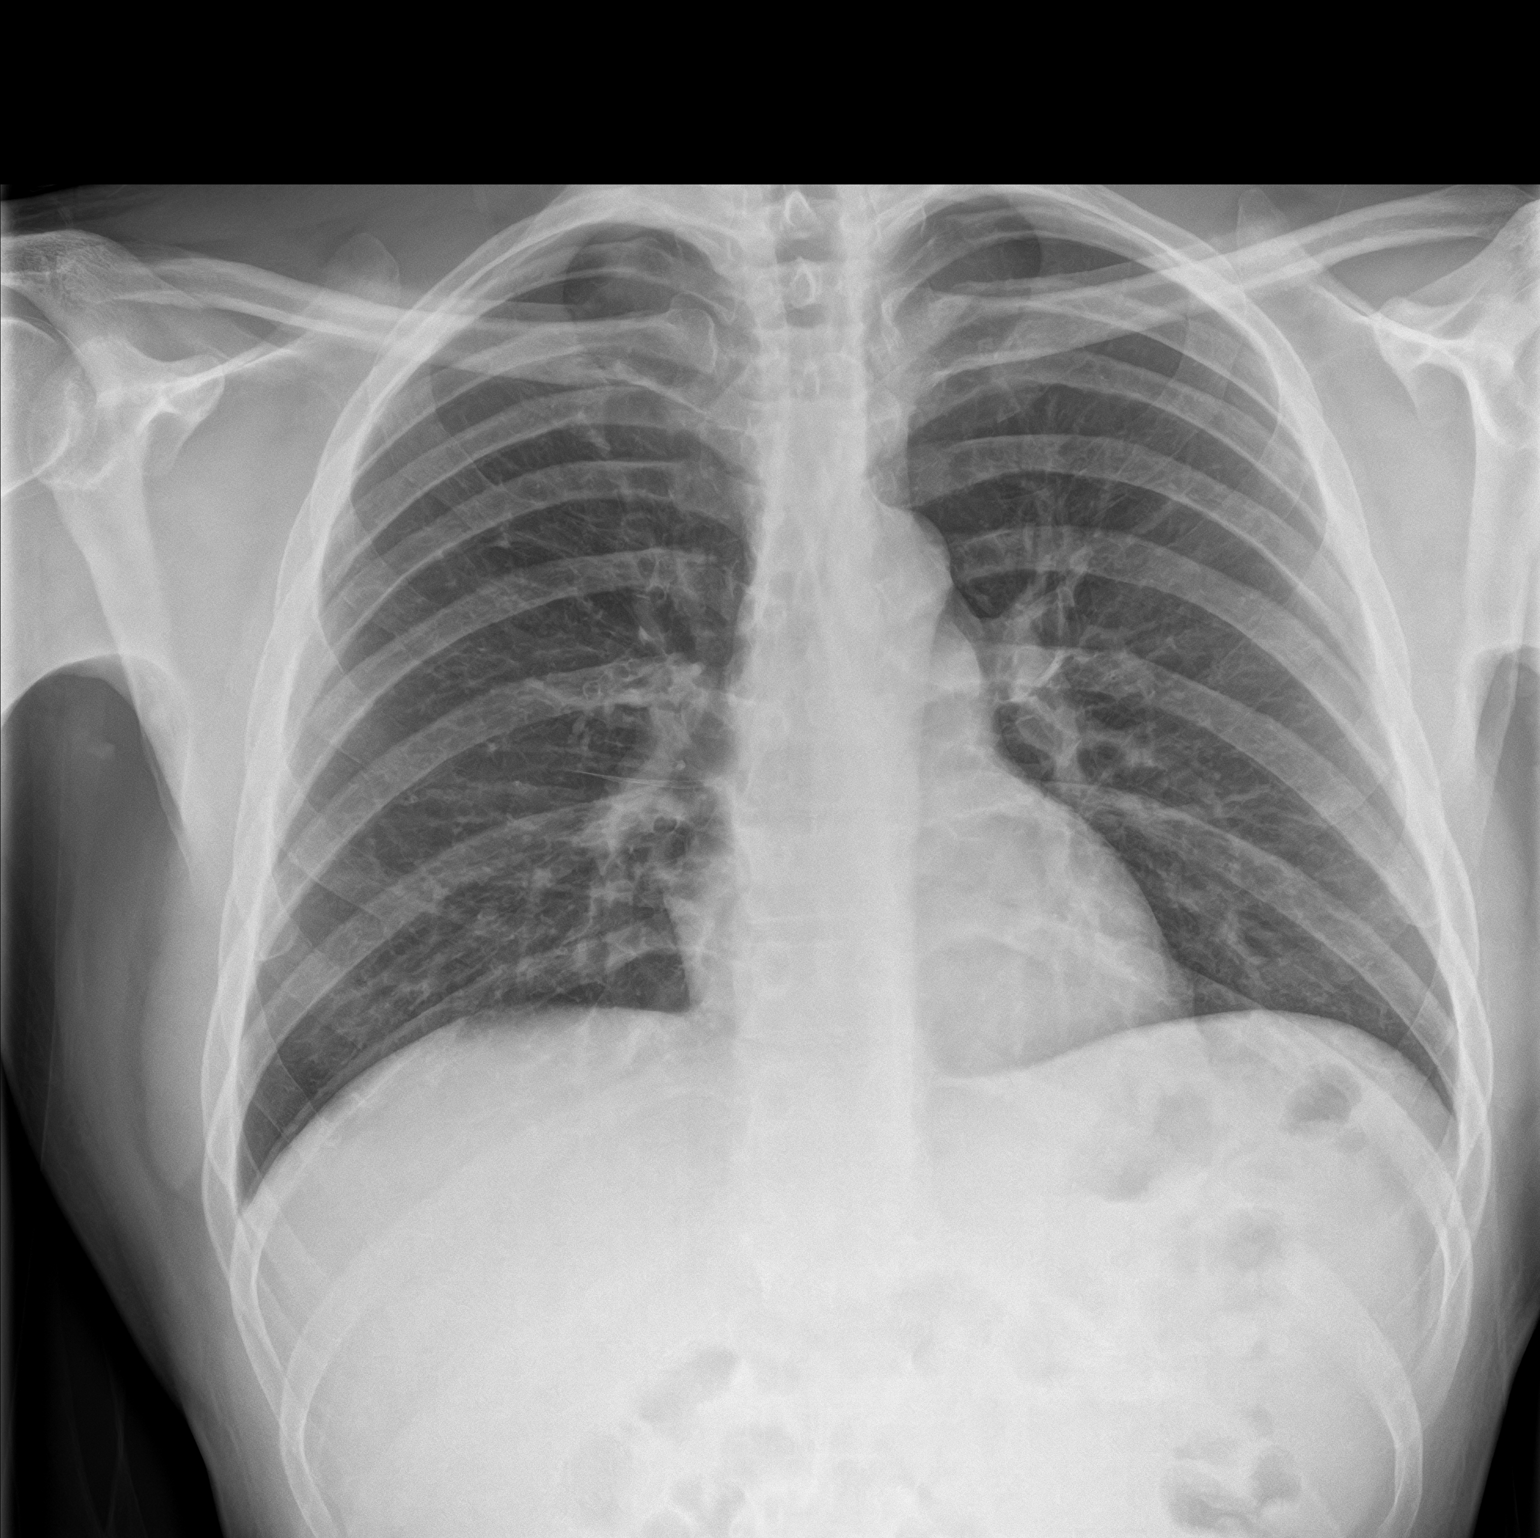
[im 2/2]
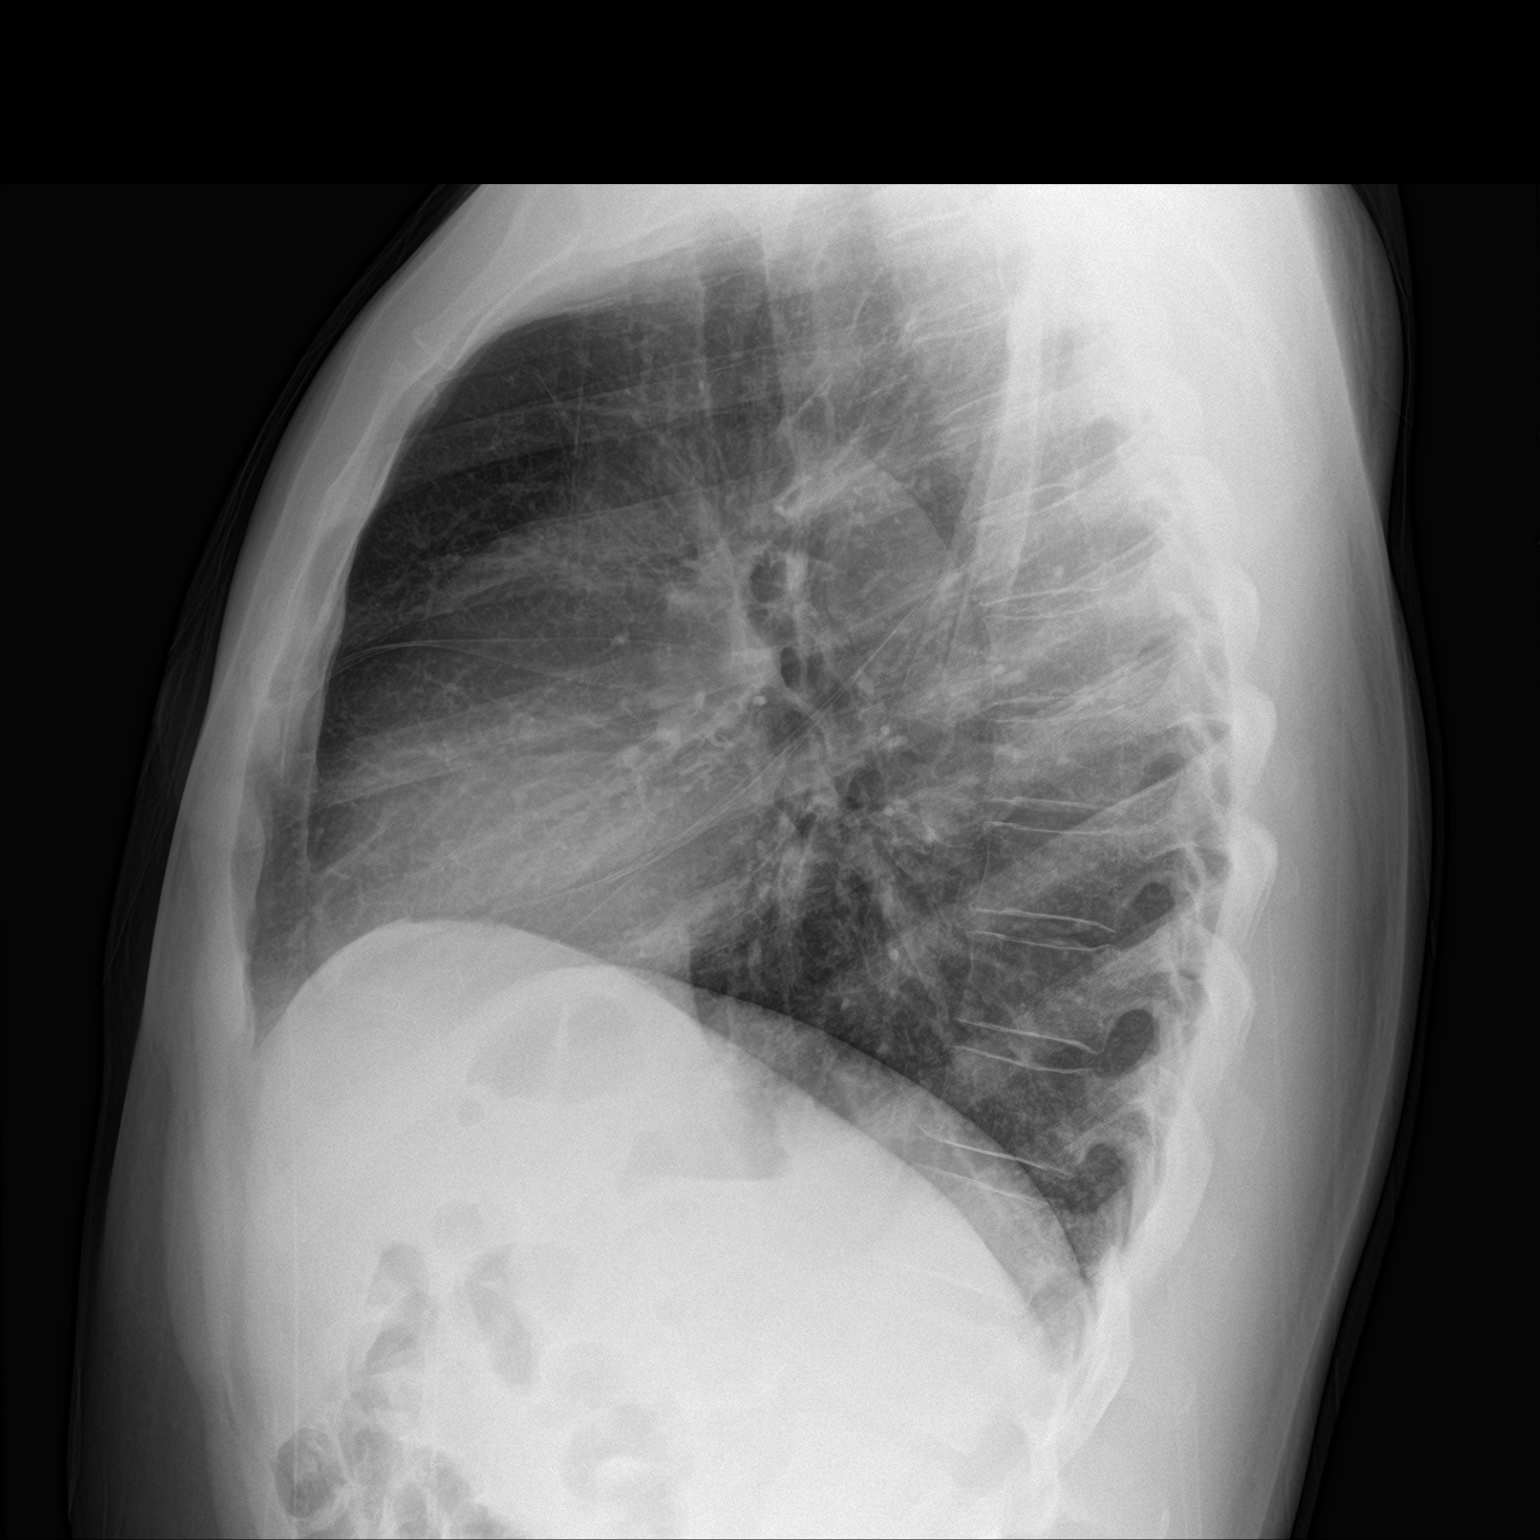

[2 of 2 positions shown; findings below may reference images not displayed]

FINDINGS: The heart size and mediastinal contours are within normal limits.
Both lungs are clear. The visualized skeletal structures are
unremarkable.
IMPRESSION: No active cardiopulmonary disease.

## 2021-01-25 NOTE — ED Notes (Signed)
Pt to ED c/o congestion, cough and generalized body aches since 3 days ago. Pt resting in bed in NAD.

## 2021-01-25 NOTE — Discharge Instructions (Signed)
Please take Tylenol and ibuprofen/Advil for your pain.  It is safe to take them together, or to alternate them every few hours.  Take up to 1000mg of Tylenol at a time, up to 4 times per day.  Do not take more than 4000 mg of Tylenol in 24 hours.  For ibuprofen, take 400-600 mg, 4-5 times per day. ° ° °

## 2021-01-25 NOTE — ED Triage Notes (Signed)
Pt comes with c/o cough and boyd aches since Saturday.

## 2021-01-25 NOTE — ED Provider Notes (Signed)
[facility] Emergency Department Provider Note ____________________________________________   Event Date/Time   First MD Initiated Contact with Patient 01/25/21 1040     (approximate)  I have reviewed the triage vital signs and the nursing notes.  HISTORY  Chief Complaint Cough   HPI Luster Hechler is a 45 y.o. malewho presents to the ED for evaluation of cough.   Chart review indicates hx DM.   Patient presents to the ED for evaluation of 3 days of generalized myalgias, cough and malaise.  Denies sick contacts.  Wife is not sick at home.  Denies productive cough, chest pain, syncope, abdominal pain or emesis.  Reports poor p.o. intake and nausea without emesis.  Past Medical History:  Diagnosis Date   Diabetes mellitus without complication [facility])     Patient Active Problem List   Diagnosis Date Noted   Right foot infection 06/21/2020   Hyperglycemia 06/21/2020   Hyperosmolar hyponatremia 06/21/2020    History reviewed. No pertinent surgical history.  Prior to Admission medications   Medication Sig Start Date End Date Taking? Authorizing Provider  blood glucose meter kit and supplies KIT Dispense based on patient and insurance preference. Use up to four times daily as directed. 06/22/20   Little Ishikawa, MD  HYDROcodone-acetaminophen (NORCO/VICODIN) 5-325 MG tablet Take 1 tablet by mouth every 4 (four) hours as needed for moderate pain. 06/22/20 06/22/21  Little Ishikawa, MD  insulin glargine (LANTUS) 100 UNIT/ML Solostar Pen Inject 10 Units into the skin daily. 06/22/20   Little Ishikawa, MD  meloxicam (MOBIC) 15 MG tablet Take 1 tablet (15 mg total) by mouth daily. 10/19/15   Cuthriell, Charline Bills, PA-C    Allergies Patient has no known allergies.  No family history on file.  Social History Social History   Tobacco Use   Smoking status: Every Day    Packs/day: 0.50    Types: Cigarettes   Smokeless tobacco: Never  Substance  Use Topics   Alcohol use: Yes   Drug use: Not Currently    Review of Systems  Constitutional: Positive for subjective fever/chills Eyes: No visual changes. ENT: No sore throat. Cardiovascular: Denies chest pain. Respiratory: Denies shortness of breath.  Positive for cough Gastrointestinal: No abdominal pain.  No nausea, no vomiting.  No diarrhea.  No constipation. Genitourinary: Negative for dysuria. Musculoskeletal: Negative for back pain.  Positive for atraumatic myalgias  Skin: Negative for rash. Neurological: Negative for headaches, focal weakness or numbness.  ____________________________________________   PHYSICAL EXAM:  VITAL SIGNS: Vitals:   01/25/21 0756  BP: 139/89  Pulse: 92  Resp: 17  Temp: 98.8 F (37.1 C)  SpO2: 97%     Constitutional: Alert and oriented. Well appearing and in no acute distress. Eyes: Conjunctivae are normal. PERRL. EOMI. Head: Atraumatic. Nose: No congestion/rhinnorhea. Mouth/Throat: Mucous membranes are moist.  Oropharynx non-erythematous. Neck: No stridor. No cervical spine tenderness to palpation. Cardiovascular: Normal rate, regular rhythm. Grossly normal heart sounds.  Good peripheral circulation. Respiratory: Normal respiratory effort.  No retractions. Lungs CTAB. Gastrointestinal: Soft , nondistended, nontender to palpation. No CVA tenderness. Musculoskeletal: No lower extremity tenderness nor edema.  No joint effusions. No signs of acute trauma. Neurologic:  Normal speech and language. No gross focal neurologic deficits are appreciated. No gait instability noted. Skin:  Skin is warm, dry and intact. No rash noted. Psychiatric: Mood and affect are normal. Speech and behavior are normal.  ____________________________________________   LABS (all labs ordered are listed, but only abnormal  results are displayed)  Labs Reviewed  RESP PANEL BY RT-PCR (FLU A&B, COVID) ARPGX2 - Abnormal; Notable for the following components:       Result Value   Influenza A by PCR POSITIVE (*)    All other components within normal limits   ____________________________________________  12 Lead EKG   ____________________________________________  RADIOLOGY  ED MD interpretation:  CXR reviewed by me without evidence of acute cardiopulmonary pathology.  Official radiology report(s): DG Chest 2 View  Result Date: 01/25/2021 CLINICAL DATA:  Fever and cough. Patient tested positive for influenza. EXAM: CHEST - 2 VIEW COMPARISON:  Two-view chest x-ray 06/21/2020 FINDINGS: The heart size and mediastinal contours are within normal limits. Both lungs are clear. The visualized skeletal structures are unremarkable. IMPRESSION: No active cardiopulmonary disease. Electronically Signed   By: San Morelle M.D.   On: 01/25/2021 11:03    ____________________________________________   PROCEDURES and INTERVENTIONS  Procedure(s) performed (including Critical Care):  Procedures  Medications - No data to display  ____________________________________________   MDM / ED COURSE   45 year old male presents to the ED with viral syndrome, testing positive for influenza and amenable to outpatient management.  No evidence of sepsis.  Due to his cough and history of diabetes, CXR obtained and without evidence of CAP or PTX.  He looks clinically well.  No barriers to outpatient management.  We will discharge with return precautions and note for work.     ____________________________________________   FINAL CLINICAL IMPRESSION(S) / ED DIAGNOSES  Final diagnoses:  Influenza A     ED Discharge Orders     None        Graceanna Theissen   Note:  This document was prepared using Dragon voice recognition software and may include unintentional dictation errors.    Vladimir Crofts, MD 01/25/21 1122

## 2021-03-20 ENCOUNTER — Emergency Department: Payer: No Typology Code available for payment source

## 2021-03-20 ENCOUNTER — Other Ambulatory Visit: Payer: Self-pay

## 2021-03-20 ENCOUNTER — Inpatient Hospital Stay
Admission: EM | Admit: 2021-03-20 | Discharge: 2021-03-22 | DRG: 638 | Disposition: A | Discharge status: Home / Self Care | Payer: No Typology Code available for payment source | Attending: Student | Admitting: Student

## 2021-03-20 DIAGNOSIS — L97419 Non-pressure chronic ulcer of right heel and midfoot with unspecified severity: Secondary | ICD-10-CM | POA: Diagnosis present

## 2021-03-20 DIAGNOSIS — F17210 Nicotine dependence, cigarettes, uncomplicated: Secondary | ICD-10-CM | POA: Diagnosis present

## 2021-03-20 DIAGNOSIS — E871 Hypo-osmolality and hyponatremia: Secondary | ICD-10-CM | POA: Diagnosis present

## 2021-03-20 DIAGNOSIS — Z791 Long term (current) use of non-steroidal anti-inflammatories (NSAID): Secondary | ICD-10-CM

## 2021-03-20 DIAGNOSIS — R590 Localized enlarged lymph nodes: Secondary | ICD-10-CM | POA: Diagnosis present

## 2021-03-20 DIAGNOSIS — R1900 Intra-abdominal and pelvic swelling, mass and lump, unspecified site: Secondary | ICD-10-CM | POA: Diagnosis not present

## 2021-03-20 DIAGNOSIS — E11621 Type 2 diabetes mellitus with foot ulcer: Principal | ICD-10-CM | POA: Diagnosis present

## 2021-03-20 DIAGNOSIS — D649 Anemia, unspecified: Secondary | ICD-10-CM | POA: Diagnosis present

## 2021-03-20 DIAGNOSIS — Z20822 Contact with and (suspected) exposure to covid-19: Secondary | ICD-10-CM | POA: Diagnosis present

## 2021-03-20 DIAGNOSIS — A419 Sepsis, unspecified organism: Secondary | ICD-10-CM | POA: Diagnosis present

## 2021-03-20 DIAGNOSIS — Z79899 Other long term (current) drug therapy: Secondary | ICD-10-CM

## 2021-03-20 DIAGNOSIS — E11628 Type 2 diabetes mellitus with other skin complications: Secondary | ICD-10-CM | POA: Diagnosis present

## 2021-03-20 DIAGNOSIS — L089 Local infection of the skin and subcutaneous tissue, unspecified: Secondary | ICD-10-CM

## 2021-03-20 DIAGNOSIS — I10 Essential (primary) hypertension: Secondary | ICD-10-CM

## 2021-03-20 DIAGNOSIS — B351 Tinea unguium: Secondary | ICD-10-CM | POA: Diagnosis present

## 2021-03-20 DIAGNOSIS — E1165 Type 2 diabetes mellitus with hyperglycemia: Secondary | ICD-10-CM

## 2021-03-20 DIAGNOSIS — F17200 Nicotine dependence, unspecified, uncomplicated: Secondary | ICD-10-CM

## 2021-03-20 DIAGNOSIS — Z794 Long term (current) use of insulin: Secondary | ICD-10-CM

## 2021-03-20 DIAGNOSIS — L97509 Non-pressure chronic ulcer of other part of unspecified foot with unspecified severity: Secondary | ICD-10-CM

## 2021-03-20 LAB — CBC WITH DIFFERENTIAL/PLATELET
Abs Immature Granulocytes: 0.02 10*3/uL (ref 0.00–0.07)
Basophils Absolute: 0 10*3/uL (ref 0.0–0.1)
Basophils Relative: 1 %
Eosinophils Absolute: 0.1 10*3/uL (ref 0.0–0.5)
Eosinophils Relative: 1 %
HCT: 34.3 % — ABNORMAL LOW (ref 39.0–52.0)
Hemoglobin: 11.2 g/dL — ABNORMAL LOW (ref 13.0–17.0)
Immature Granulocytes: 0 %
Lymphocytes Relative: 19 %
Lymphs Abs: 1.5 10*3/uL (ref 0.7–4.0)
MCH: 27.1 pg (ref 26.0–34.0)
MCHC: 32.7 g/dL (ref 30.0–36.0)
MCV: 82.9 fL (ref 80.0–100.0)
Monocytes Absolute: 0.6 10*3/uL (ref 0.1–1.0)
Monocytes Relative: 8 %
Neutro Abs: 5.7 10*3/uL (ref 1.7–7.7)
Neutrophils Relative %: 71 %
Platelets: 213 10*3/uL (ref 150–400)
RBC: 4.14 MIL/uL — ABNORMAL LOW (ref 4.22–5.81)
RDW: 13.1 % (ref 11.5–15.5)
WBC: 8 10*3/uL (ref 4.0–10.5)
nRBC: 0 % (ref 0.0–0.2)

## 2021-03-20 LAB — COMPREHENSIVE METABOLIC PANEL
ALT: 16 U/L (ref 0–44)
AST: 17 U/L (ref 15–41)
Albumin: 3.9 g/dL (ref 3.5–5.0)
Alkaline Phosphatase: 59 U/L (ref 38–126)
Anion gap: 9 (ref 5–15)
BUN: 22 mg/dL — ABNORMAL HIGH (ref 6–20)
CO2: 28 mmol/L (ref 22–32)
Calcium: 9 mg/dL (ref 8.9–10.3)
Chloride: 96 mmol/L — ABNORMAL LOW (ref 98–111)
Creatinine, Ser: 1.21 mg/dL (ref 0.61–1.24)
GFR, Estimated: >60 mL/min (ref >60)
Glucose, Bld: 215 mg/dL — ABNORMAL HIGH (ref 70–99)
Potassium: 4 mmol/L (ref 3.5–5.1)
Sodium: 133 mmol/L — ABNORMAL LOW (ref 135–145)
Total Bilirubin: 0.5 mg/dL (ref 0.3–1.2)
Total Protein: 7.4 g/dL (ref 6.5–8.1)

## 2021-03-20 LAB — LACTIC ACID, PLASMA: Lactic Acid, Venous: 0.7 mmol/L (ref 0.5–1.9)

## 2021-03-20 NOTE — ED Provider Triage Note (Signed)
Emergency Medicine Provider Triage Evaluation Note  Donald Carpenter , a 46 y.o. male  was evaluated in triage.  Pt complains of foot pain.  Patient presents to the emergency department with an ulcer to the right foot.  This is somewhat chronic in nature, patient does have a podiatrist at the [place].  Patient states that he is having increased pain and drainage to the wound.  He believes that he has a worsening infection.  No fevers and chills..  Review of Systems  Positive: Worsening foot infection to the right foot Negative: Fevers or chills  Physical Exam  BP 129/80 (BP Location: Right Arm)    Pulse (!) 102    Temp 99.4 F (37.4 C) (Oral)    Resp 18    Ht 6\' 5"  (1.956 m)    Wt 83.9 kg    SpO2 100%    BMI 21.94 kg/m  Gen:   Awake, no distress   Resp:  Normal effort  MSK:   Moves extremities without difficulty.  Patient has a necrotic looking wound to the sole of the right foot.  While there is no active drainage patient had a bandage that was covered in pus.   Other:    Medical Decision Making  Medically screening exam initiated at 5:49 PM.  Appropriate orders placed.  Donald Carpenter was informed that the remainder of the evaluation will be completed by another provider, this initial triage assessment does not replace that evaluation, and the importance of remaining in the ED until their evaluation is complete.  Patient presents with a worsening ulcer to the plantar aspect of the right foot.  Deep ulceration to the plantar aspect of the right foot.  Not actively draining the patient has bandage from home with pus on it.  This is rewrapped.  Patient will have x-ray, labs at this time.   Donald Rower, PA-C 03/20/21 1749

## 2021-03-20 NOTE — ED Triage Notes (Signed)
Pt states he has a callous on his R foot that he believes in infected- pt has had this happen before- pt also states he has a knot in the top of his R hip that is also bothering him

## 2021-03-21 ENCOUNTER — Emergency Department: Payer: No Typology Code available for payment source

## 2021-03-21 ENCOUNTER — Encounter: Payer: Self-pay | Admitting: Internal Medicine

## 2021-03-21 ENCOUNTER — Inpatient Hospital Stay: Payer: No Typology Code available for payment source

## 2021-03-21 DIAGNOSIS — R1900 Intra-abdominal and pelvic swelling, mass and lump, unspecified site: Secondary | ICD-10-CM | POA: Diagnosis present

## 2021-03-21 DIAGNOSIS — L089 Local infection of the skin and subcutaneous tissue, unspecified: Secondary | ICD-10-CM | POA: Diagnosis not present

## 2021-03-21 DIAGNOSIS — I10 Essential (primary) hypertension: Secondary | ICD-10-CM

## 2021-03-21 DIAGNOSIS — E11621 Type 2 diabetes mellitus with foot ulcer: Secondary | ICD-10-CM | POA: Diagnosis present

## 2021-03-21 DIAGNOSIS — Z791 Long term (current) use of non-steroidal anti-inflammatories (NSAID): Secondary | ICD-10-CM | POA: Diagnosis not present

## 2021-03-21 DIAGNOSIS — R590 Localized enlarged lymph nodes: Secondary | ICD-10-CM | POA: Diagnosis present

## 2021-03-21 DIAGNOSIS — E871 Hypo-osmolality and hyponatremia: Secondary | ICD-10-CM | POA: Diagnosis present

## 2021-03-21 DIAGNOSIS — E11628 Type 2 diabetes mellitus with other skin complications: Secondary | ICD-10-CM

## 2021-03-21 DIAGNOSIS — D649 Anemia, unspecified: Secondary | ICD-10-CM | POA: Diagnosis present

## 2021-03-21 DIAGNOSIS — F17210 Nicotine dependence, cigarettes, uncomplicated: Secondary | ICD-10-CM | POA: Diagnosis present

## 2021-03-21 DIAGNOSIS — A419 Sepsis, unspecified organism: Secondary | ICD-10-CM | POA: Diagnosis present

## 2021-03-21 DIAGNOSIS — L97509 Non-pressure chronic ulcer of other part of unspecified foot with unspecified severity: Secondary | ICD-10-CM | POA: Diagnosis present

## 2021-03-21 DIAGNOSIS — Z79899 Other long term (current) drug therapy: Secondary | ICD-10-CM | POA: Diagnosis not present

## 2021-03-21 DIAGNOSIS — Z794 Long term (current) use of insulin: Secondary | ICD-10-CM

## 2021-03-21 DIAGNOSIS — Z20822 Contact with and (suspected) exposure to covid-19: Secondary | ICD-10-CM | POA: Diagnosis present

## 2021-03-21 DIAGNOSIS — E1165 Type 2 diabetes mellitus with hyperglycemia: Secondary | ICD-10-CM

## 2021-03-21 DIAGNOSIS — L97419 Non-pressure chronic ulcer of right heel and midfoot with unspecified severity: Secondary | ICD-10-CM | POA: Diagnosis present

## 2021-03-21 DIAGNOSIS — B351 Tinea unguium: Secondary | ICD-10-CM | POA: Diagnosis present

## 2021-03-21 LAB — LACTIC ACID, PLASMA: Lactic Acid, Venous: 0.6 mmol/L (ref 0.5–1.9)

## 2021-03-21 LAB — RENAL FUNCTION PANEL
Albumin: 3.2 g/dL — ABNORMAL LOW (ref 3.5–5.0)
Anion gap: 6 (ref 5–15)
BUN: 19 mg/dL (ref 6–20)
CO2: 28 mmol/L (ref 22–32)
Calcium: 8.2 mg/dL — ABNORMAL LOW (ref 8.9–10.3)
Chloride: 96 mmol/L — ABNORMAL LOW (ref 98–111)
Creatinine, Ser: 1.07 mg/dL (ref 0.61–1.24)
GFR, Estimated: >60 mL/min (ref >60)
Glucose, Bld: 260 mg/dL — ABNORMAL HIGH (ref 70–99)
Phosphorus: 2.9 mg/dL (ref 2.5–4.6)
Potassium: 4.2 mmol/L (ref 3.5–5.1)
Sodium: 130 mmol/L — ABNORMAL LOW (ref 135–145)

## 2021-03-21 LAB — CBG MONITORING, ED: Glucose-Capillary: 269 mg/dL — ABNORMAL HIGH (ref 70–99)

## 2021-03-21 LAB — CBC
HCT: 32.4 % — ABNORMAL LOW (ref 39.0–52.0)
Hemoglobin: 10.7 g/dL — ABNORMAL LOW (ref 13.0–17.0)
MCH: 27.4 pg (ref 26.0–34.0)
MCHC: 33 g/dL (ref 30.0–36.0)
MCV: 82.9 fL (ref 80.0–100.0)
Platelets: 182 10*3/uL (ref 150–400)
RBC: 3.91 MIL/uL — ABNORMAL LOW (ref 4.22–5.81)
RDW: 13 % (ref 11.5–15.5)
WBC: 4.1 10*3/uL (ref 4.0–10.5)
nRBC: 0 % (ref 0.0–0.2)

## 2021-03-21 LAB — RESP PANEL BY RT-PCR (FLU A&B, COVID) ARPGX2
Influenza A by PCR: NEGATIVE
Influenza B by PCR: NEGATIVE
SARS Coronavirus 2 by RT PCR: NEGATIVE

## 2021-03-21 LAB — GLUCOSE, CAPILLARY: Glucose-Capillary: 137 mg/dL — ABNORMAL HIGH (ref 70–99)

## 2021-03-21 LAB — SEDIMENTATION RATE: Sed Rate: 52 mm/hr — ABNORMAL HIGH (ref 0–15)

## 2021-03-21 LAB — PROCALCITONIN: Procalcitonin: 0.1 ng/mL

## 2021-03-21 LAB — C-REACTIVE PROTEIN: CRP: 4 mg/dL — ABNORMAL HIGH (ref <1.0)

## 2021-03-21 LAB — MAGNESIUM: Magnesium: 1.8 mg/dL (ref 1.7–2.4)

## 2021-03-21 IMAGING — US US EXTREM LOW*R* LIMITED
1 series · 14 of 25 positions shown · non-contrast
Comparison: None.

CLINICAL DATA: Right groin mass

EXAM:
ULTRASOUND RIGHT LOWER EXTREMITY LIMITED
TECHNIQUE: Ultrasound examination of the lower extremity soft tissues was
performed in the area of clinical concern.

[Series 1: us soft tissue lower extremity limited right (non- · 54 acquisitions, 14 frames shown]
[im 1/54]
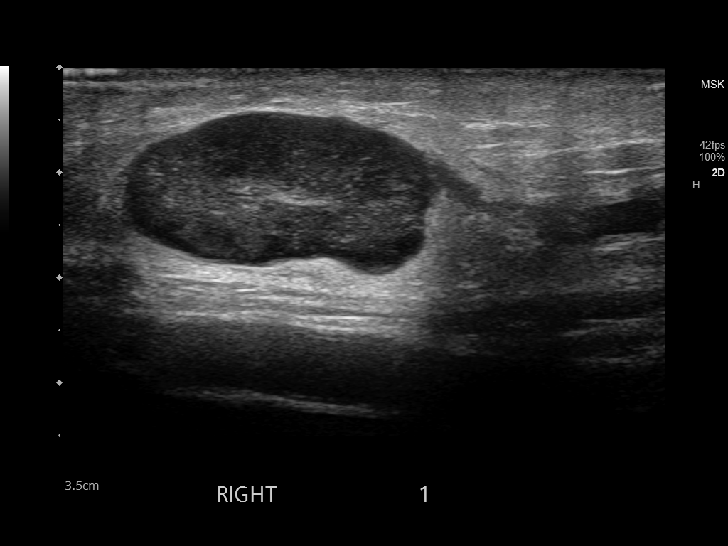
[im 5/54]
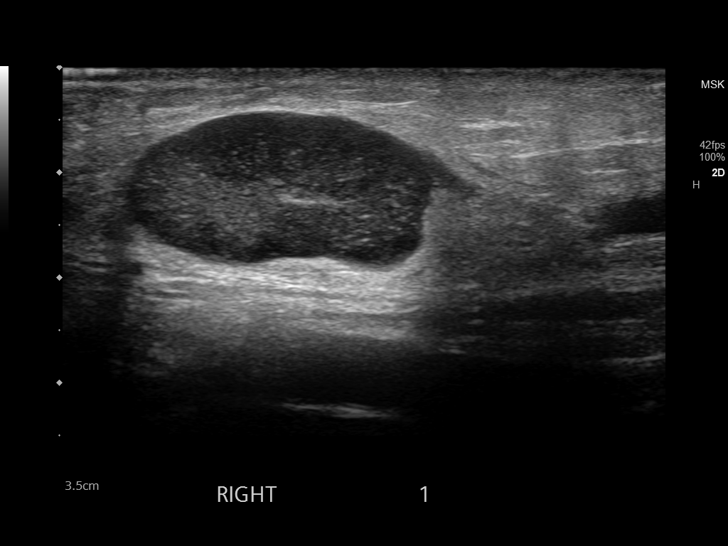
[im 9/54]
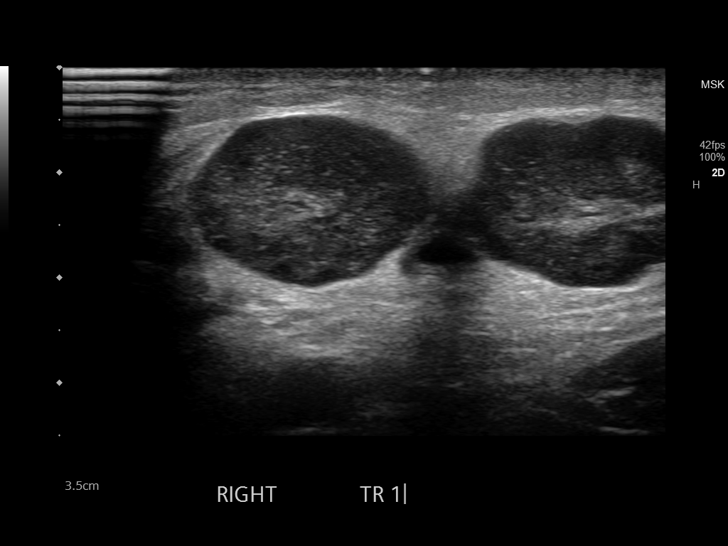
[im 14/54]
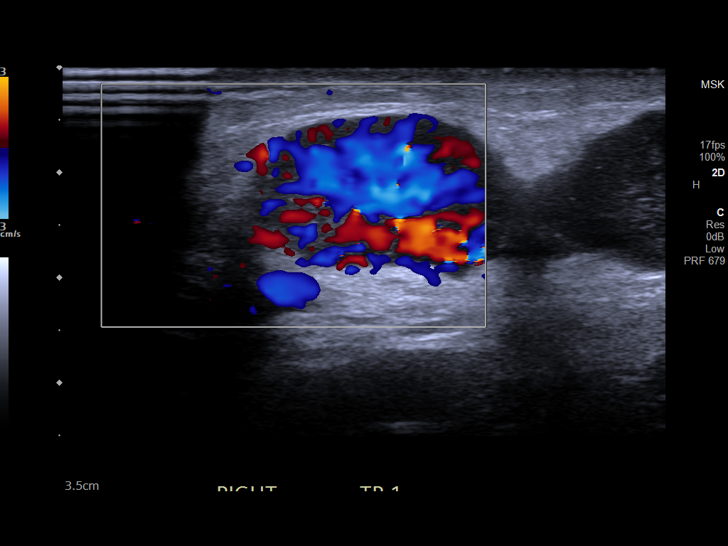
[im 18/54]
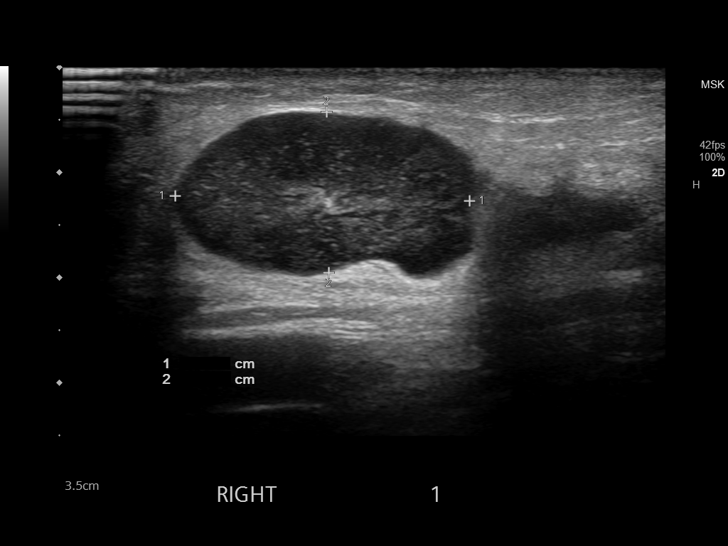
[im 20/54]
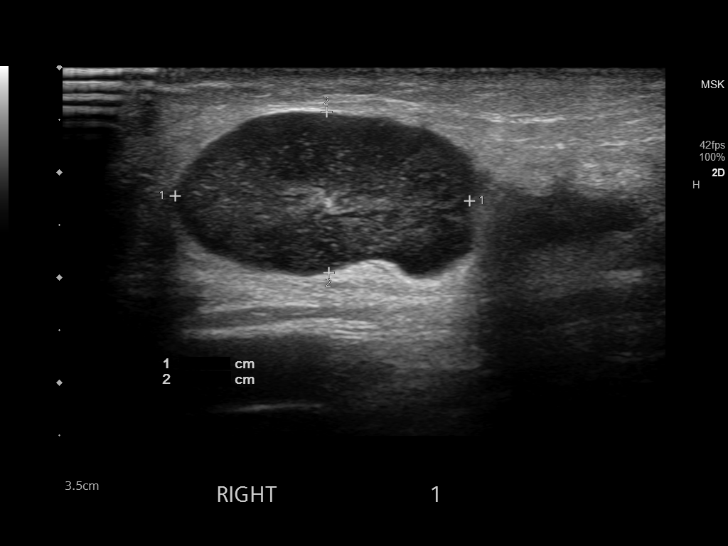
[im 25/54]
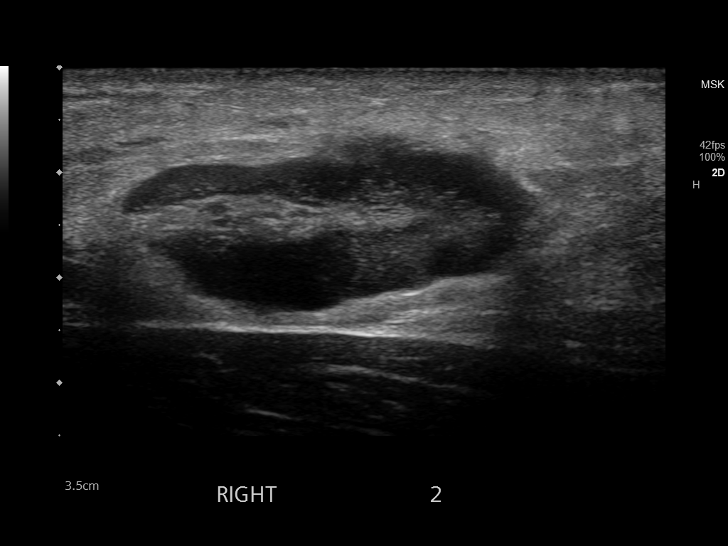
[im 29/54]
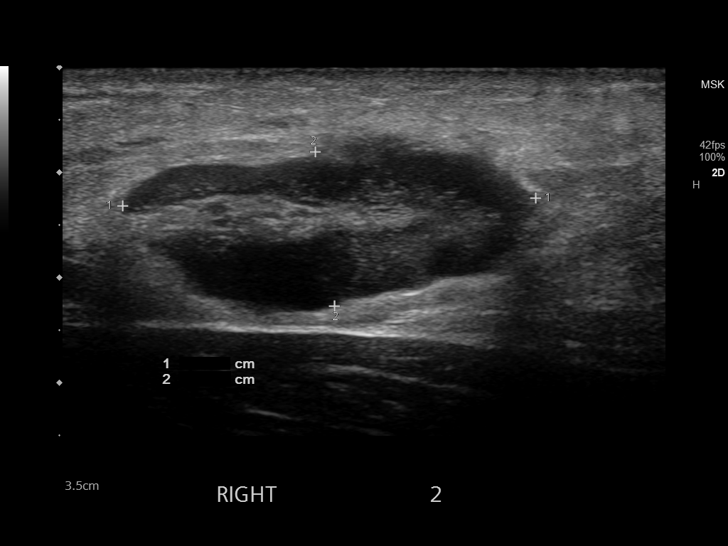
[im 34/54]
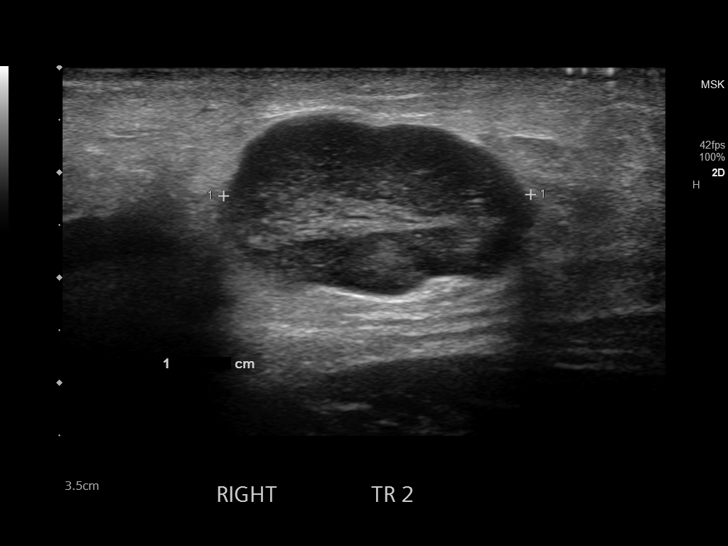
[im 36/54]
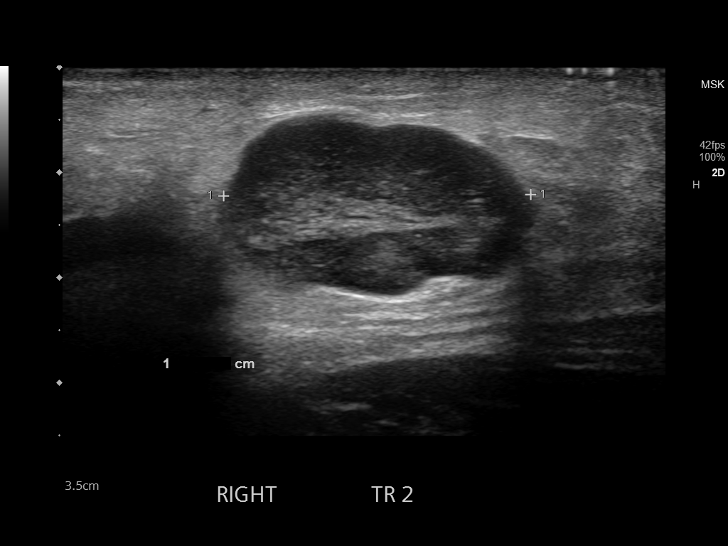
[im 40/54]
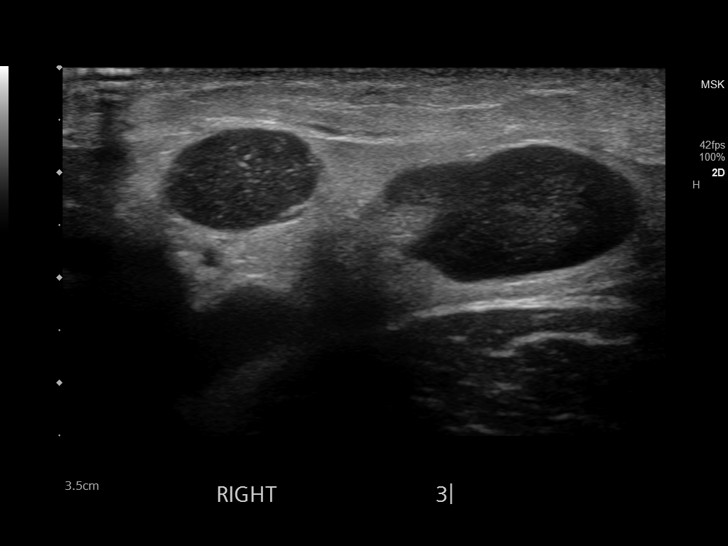
[im 45/54]
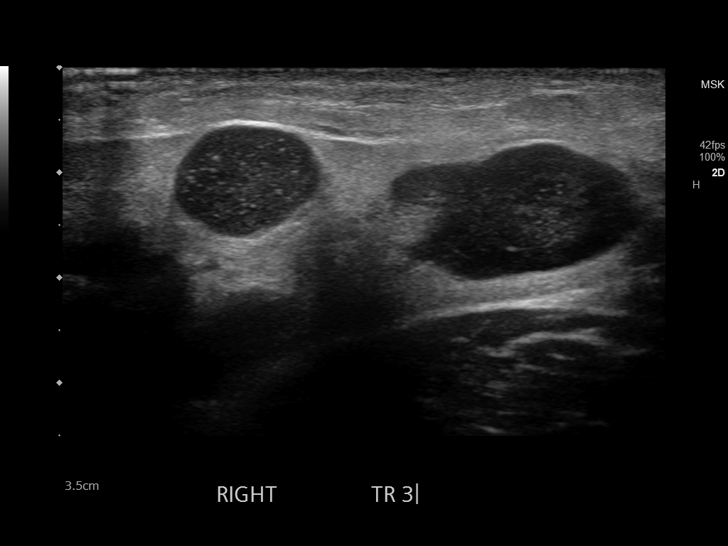
[im 49/54]
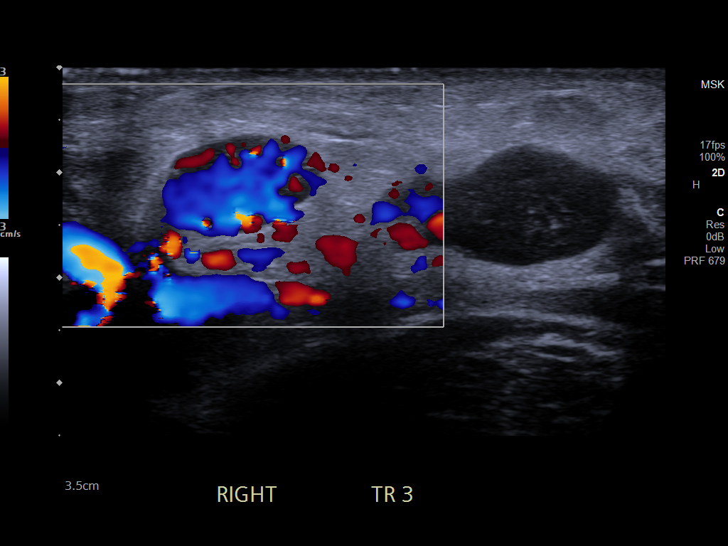
[im 54/54]
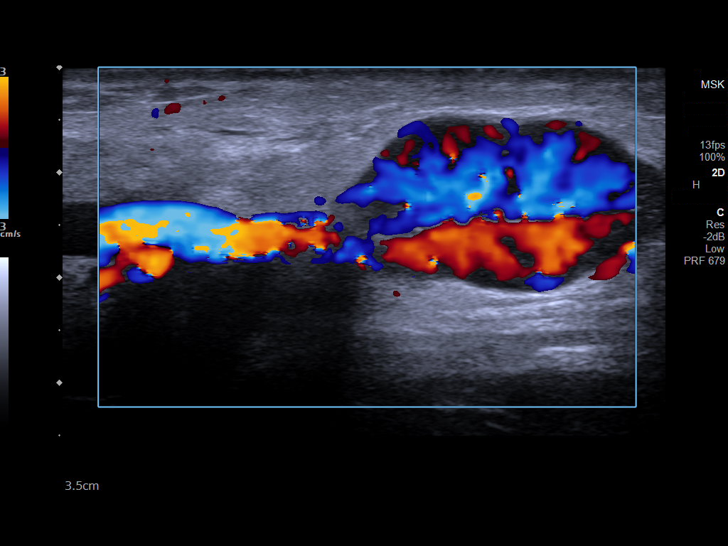

[14 of 25 positions shown; findings below may reference images not displayed]

FINDINGS: There are multiple enlarged lymph nodes in the right groin, the
largest measuring 3.9 x 2.9 x 1.5 cm.
IMPRESSION: Right inguinal adenopathy. Recommend clinical follow-up to ensure
resolution.

## 2021-03-21 IMAGING — MR MR FOOT*R* W/O CM
5 series · 40 of 40 positions shown · non-contrast
Comparison: Right foot x-ray [DATE]

CLINICAL DATA: Foot swelling, diabetic, osteomyelitis suspected

EXAM:
MRI OF THE RIGHT FOREFOOT WITHOUT CONTRAST
TECHNIQUE: Multiplanar, multisequence MR imaging of the right was performed. No
intravenous contrast was administered.

[Series 5: T1 · coronal · right · 3.0mm · 0.38mm/px · 10 of 45 slices shown (1 of 2)]
[im 1/45]
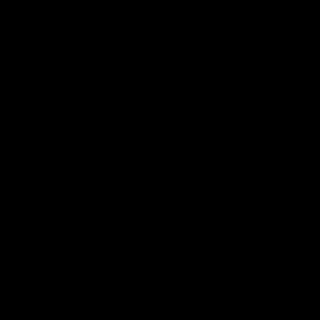
[im 5/45]
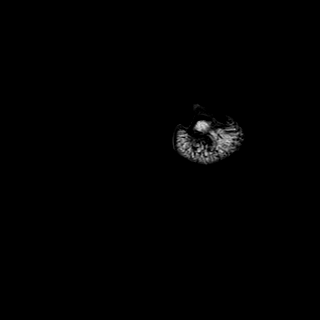
[im 10/45]
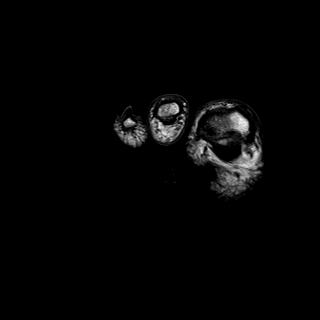
[im 15/45]
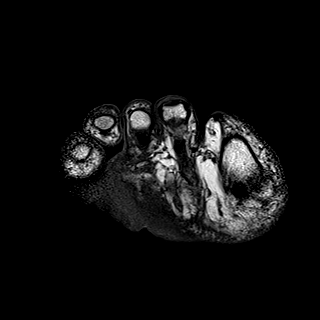
[im 20/45]
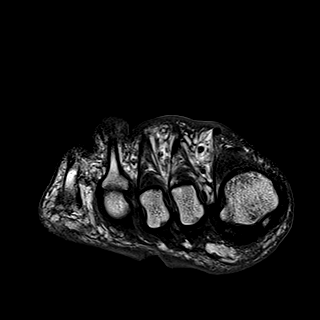
[im 25/45]
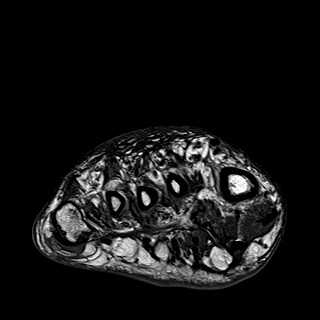
[im 30/45]
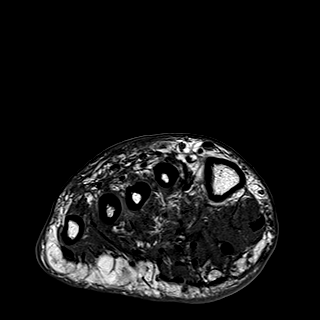
[im 35/45]
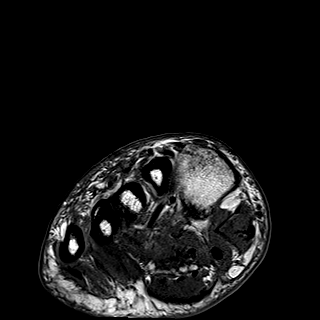
[im 40/45]
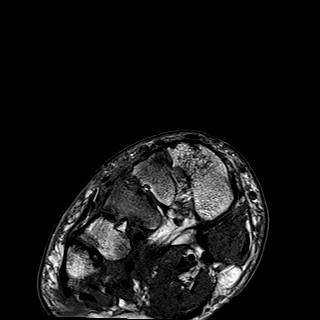
[im 45/45]
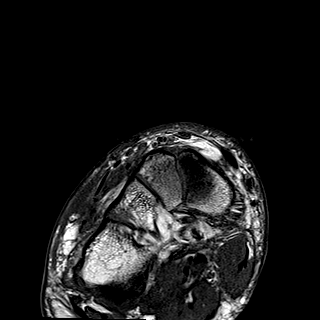

[Series 7: T2 · coronal · right · 3.0mm · 0.38mm/px · 11 of 45 slices shown (1 of 2)]
[im 1/45]
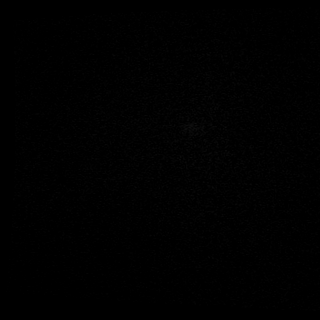
[im 5/45]
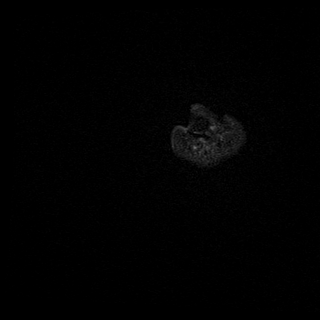
[im 9/45]
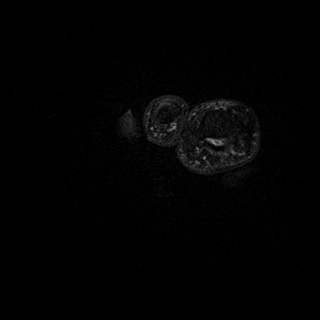
[im 14/45]
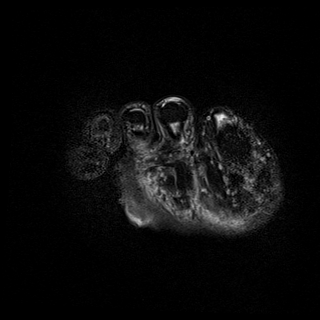
[im 18/45]
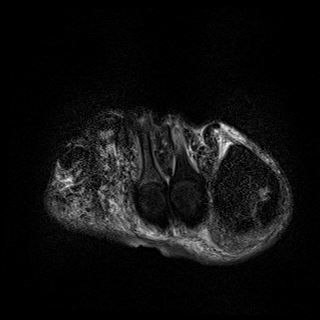
[im 23/45]
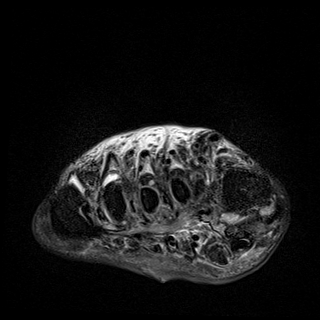
[im 27/45]
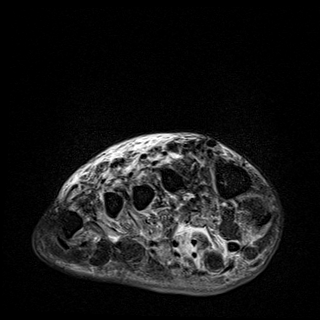
[im 31/45]
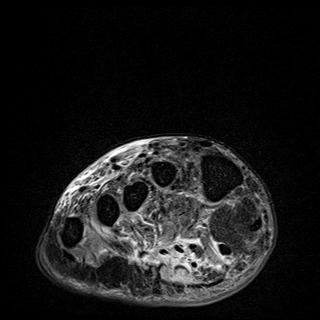
[im 36/45]
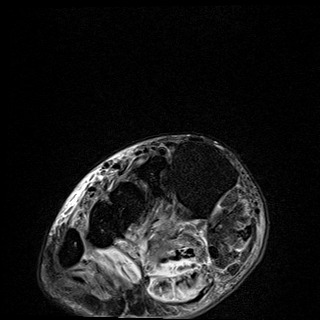
[im 40/45]
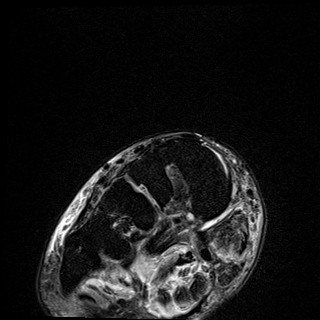
[im 45/45]
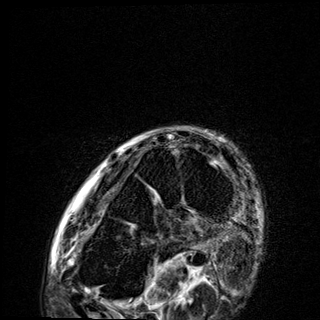

[Series 8: T1 · oblique · right · 3.0mm · 0.70mm/px · 6 of 24 slices shown (2 of 2)]
[im 1/24]
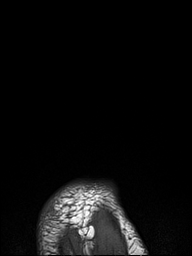
[im 5/24]
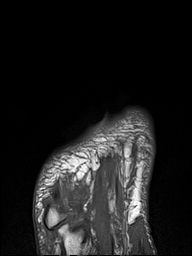
[im 10/24]
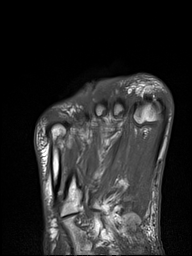
[im 14/24]
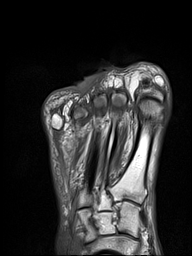
[im 19/24]
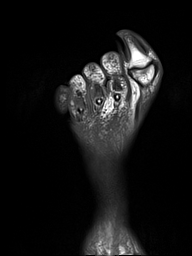
[im 24/24]
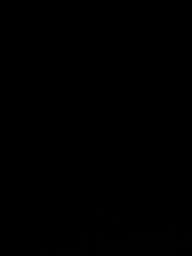

[Series 10: T2 · oblique · right · 3.0mm · 0.70mm/px · 6 of 24 slices shown (2 of 2)]
[im 1/24]
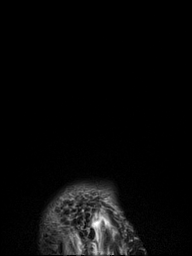
[im 5/24]
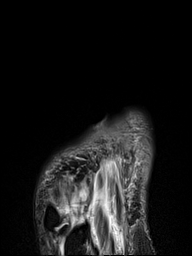
[im 10/24]
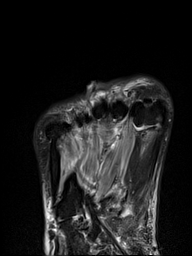
[im 14/24]
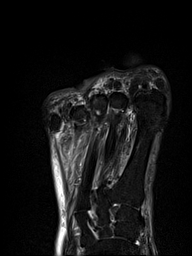
[im 19/24]
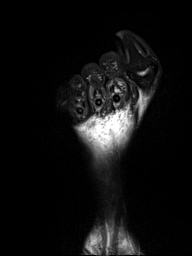
[im 24/24]
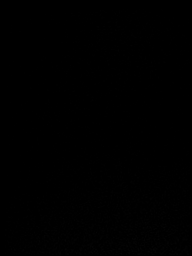

[Series 11: STIR · sagittal · right · 3.0mm · 0.62mm/px · 7 of 31 slices shown]
[im 1/31]
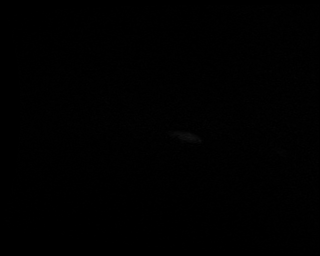
[im 6/31]
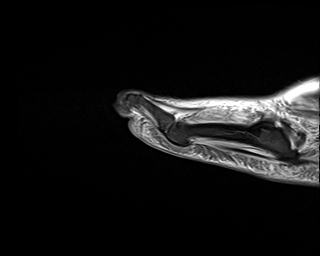
[im 11/31]
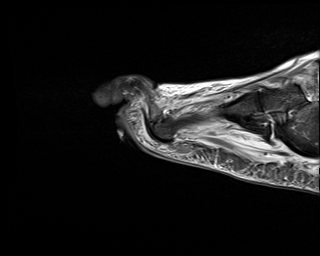
[im 16/31]
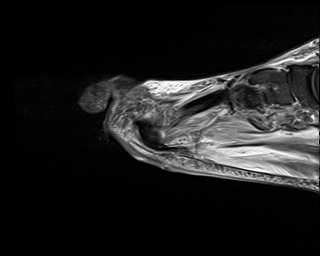
[im 21/31]
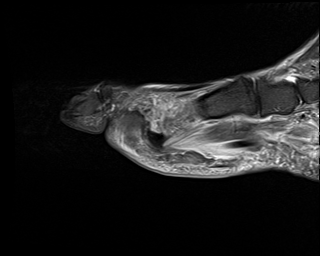
[im 26/31]
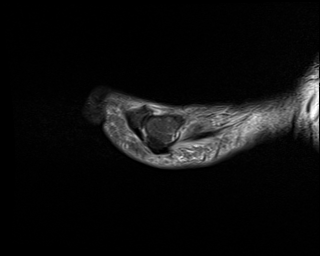
[im 31/31]
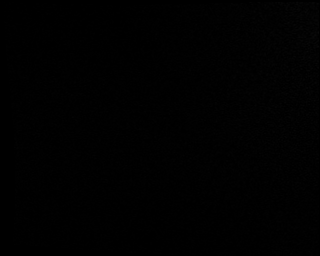

[40 of 40 positions shown; findings below may reference images not displayed]

FINDINGS: Bones/Joint/Cartilage

No suspicious bone marrow edema or destructive bony changes
identified to suggest acute osteomyelitis. No acute fracture or
dislocation identified.

Ligaments

Grossly unremarkable.

Muscles and Tendons

Diffuse edema throughout the intrinsic musculature of the foot.
Tendons are intact. No evidence of tenosynovitis visualized.

Soft tissues

Soft tissue ulceration at the plantar aspect of the foot region of
the third metatarsal head. No underlying defined fluid
collection/abscess identified. Diffuse subcutaneous soft tissue
edema most prominent dorsally.
IMPRESSION: 1. No evidence of acute osteomyelitis.
2. Soft tissue ulcer at the plantar aspect of the foot region of the
third metatarsal head.
3. Subcutaneous soft tissue edema most prominent dorsally and
diffuse edema of the intrinsic musculature of the foot.

## 2021-03-21 MED ORDER — VANCOMYCIN HCL 1250 MG/250ML IV SOLN
1250.0000 mg | Freq: Two times a day (BID) | INTRAVENOUS | Status: DC
  Administered 2021-03-21 – 2021-03-22 (×2): 1250 mg via INTRAVENOUS
  Filled 2021-03-21 (×4): qty 250

## 2021-03-21 MED ORDER — PIPERACILLIN-TAZOBACTAM 3.375 G IVPB 30 MIN
3.3750 g | Freq: Once | INTRAVENOUS | Status: AC
Start: 2021-03-21 — End: 2021-03-21
  Administered 2021-03-21: 3.375 g via INTRAVENOUS
  Filled 2021-03-21: qty 50

## 2021-03-21 MED ORDER — ONDANSETRON HCL 4 MG/2ML IJ SOLN: 4.0000 mg | Freq: Four times a day (QID) | INTRAMUSCULAR | Status: DC | PRN

## 2021-03-21 MED ORDER — ENOXAPARIN SODIUM 40 MG/0.4ML IJ SOSY
40.0000 mg | PREFILLED_SYRINGE | INTRAMUSCULAR | Status: DC
  Administered 2021-03-21: 40 mg via SUBCUTANEOUS
  Filled 2021-03-21: qty 0.4

## 2021-03-21 MED ORDER — ATORVASTATIN CALCIUM 20 MG PO TABS
20.0000 mg | ORAL_TABLET | Freq: Every day | ORAL | Status: DC
  Administered 2021-03-21 – 2021-03-22 (×2): 20 mg via ORAL
  Filled 2021-03-21 (×2): qty 1

## 2021-03-21 MED ORDER — ONDANSETRON HCL 4 MG PO TABS
4.0000 mg | ORAL_TABLET | Freq: Four times a day (QID) | ORAL | Status: DC | PRN
Start: 2021-03-21 — End: 2021-03-22

## 2021-03-21 MED ORDER — VANCOMYCIN HCL IN DEXTROSE 1-5 GM/200ML-% IV SOLN: 1000.0000 mg | Freq: Once | INTRAVENOUS | Status: DC

## 2021-03-21 MED ORDER — LACTATED RINGERS IV BOLUS (SEPSIS)
1000.0000 mL | Freq: Once | INTRAVENOUS | Status: AC
  Administered 2021-03-21: 1000 mL via INTRAVENOUS

## 2021-03-21 MED ORDER — INSULIN ASPART 100 UNIT/ML IJ SOLN: 0.0000 [IU] | Freq: Every day | INTRAMUSCULAR | Status: DC

## 2021-03-21 MED ORDER — VANCOMYCIN HCL 2000 MG/400ML IV SOLN
2000.0000 mg | Freq: Once | INTRAVENOUS | Status: AC
  Administered 2021-03-21: 2000 mg via INTRAVENOUS
  Filled 2021-03-21: qty 400

## 2021-03-21 MED ORDER — ACETAMINOPHEN 650 MG RE SUPP: 650.0000 mg | Freq: Four times a day (QID) | RECTAL | Status: DC | PRN

## 2021-03-21 MED ORDER — INSULIN GLARGINE-YFGN 100 UNIT/ML ~~LOC~~ SOLN
10.0000 [IU] | Freq: Every day | SUBCUTANEOUS | Status: DC
  Administered 2021-03-21 – 2021-03-22 (×2): 10 [IU] via SUBCUTANEOUS
  Filled 2021-03-21 (×3): qty 0.1

## 2021-03-21 MED ORDER — INSULIN ASPART 100 UNIT/ML IJ SOLN
3.0000 [IU] | Freq: Three times a day (TID) | INTRAMUSCULAR | Status: DC
  Administered 2021-03-21 – 2021-03-22 (×2): 3 [IU] via SUBCUTANEOUS
  Filled 2021-03-21 (×3): qty 1

## 2021-03-21 MED ORDER — PIPERACILLIN-TAZOBACTAM 3.375 G IVPB
3.3750 g | Freq: Three times a day (TID) | INTRAVENOUS | Status: DC
  Administered 2021-03-21 – 2021-03-22 (×5): 3.375 g via INTRAVENOUS
  Filled 2021-03-21 (×5): qty 50

## 2021-03-21 MED ORDER — LACTATED RINGERS IV SOLN: INTRAVENOUS | Status: AC

## 2021-03-21 MED ORDER — HYDROCODONE-ACETAMINOPHEN 5-325 MG PO TABS: 1.0000 | ORAL_TABLET | ORAL | Status: DC | PRN

## 2021-03-21 MED ORDER — INSULIN ASPART 100 UNIT/ML IJ SOLN
0.0000 [IU] | Freq: Three times a day (TID) | INTRAMUSCULAR | Status: DC
  Administered 2021-03-21 – 2021-03-22 (×2): 8 [IU] via SUBCUTANEOUS
  Filled 2021-03-21 (×3): qty 1

## 2021-03-21 MED ORDER — ACETAMINOPHEN 325 MG PO TABS: 650.0000 mg | ORAL_TABLET | Freq: Four times a day (QID) | ORAL | Status: DC | PRN

## 2021-03-21 NOTE — ED Provider Notes (Signed)
[facility] Provider Note    Event Date/Time   First MD Initiated Contact with Patient 03/20/21 2350     (approximate)   History   Foot Pain   HPI  Donald Carpenter is a 46 y.o. male with a history of diabetes who presents for evaluation of a foot ulcer.  Patient reports that he has had a callus on the bottom of his right foot for a while.  Used to see a podiatrist at the [place].  Over the last 2 weeks he has noticed that the ulcer has becoming bigger with foul-smelling discharge.  Over the last 2 days he noticed a swelling painful area in his right groin which is the reason why he came to the hospital.  He denies nausea or vomiting, chills, fever [place] or body aches.     Past Medical History:  Diagnosis Date   Diabetes mellitus without complication ([place])     History reviewed. No pertinent surgical history.   Physical Exam   Triage Vital Signs: ED Triage Vitals  Enc Vitals Group     BP 03/20/21 1745 129/80     Pulse Rate 03/20/21 1745 (!) 102     Resp 03/20/21 1745 18     Temp 03/20/21 1745 99.4 F (37.4 C)     Temp Source 03/20/21 1745 Oral     SpO2 03/20/21 1745 100 %     Weight 03/20/21 1743 185 lb (83.9 kg)     Height 03/20/21 1743 6\' 5"  (1.956 m)     Head Circumference --      Peak Flow --      Pain Score 03/20/21 1742 7     Pain Loc --      Pain Edu? --      Excl. in [place]? --     Most recent vital signs: Vitals:   03/20/21 1745 03/20/21 2119  BP: 129/80 135/83  Pulse: (!) 102 93  Resp: 18 14  Temp: 99.4 F (37.4 C)   SpO2: 100% 100%     Constitutional: Alert and oriented. Well appearing and in no apparent distress. HEENT:      Head: Normocephalic and atraumatic.         Eyes: Conjunctivae are normal. Sclera is non-icteric.       Mouth/Throat: Mucous membranes are moist.       Neck: Supple with no signs of meningismus. Cardiovascular: Regular rate and rhythm.  Respiratory: Normal respiratory effort. Lungs are clear to  auscultation bilaterally.  Gastrointestinal: Soft, non tender Musculoskeletal:  There is a ulcer on the sole of the right foot with involvement of muscle and some areas of necrosis, foul purulent discharge.  No crepitus.  There is an enlarged tender area in the right groin concerning for lymphadenopathy Neurologic: Normal speech and language. Face is symmetric. Moving all extremities. No gross focal neurologic deficits are appreciated. Skin: Skin is warm, dry and intact. No rash noted. Psychiatric: Mood and affect are normal. Speech and behavior are normal.  ED Results / Procedures / Treatments   Labs (all labs ordered are listed, but only abnormal results are displayed) Labs Reviewed  COMPREHENSIVE METABOLIC PANEL - Abnormal; Notable for the following components:      Result Value   Sodium 133 (*)    Chloride 96 (*)    Glucose, Bld 215 (*)    BUN 22 (*)    All other components within normal limits  CBC WITH DIFFERENTIAL/PLATELET - Abnormal; Notable for the  following components:   RBC 4.14 (*)    Hemoglobin 11.2 (*)    HCT 34.3 (*)    All other components within normal limits  RESP PANEL BY RT-PCR (FLU A&B, COVID) ARPGX2  CULTURE, BLOOD (ROUTINE X 2)  CULTURE, BLOOD (ROUTINE X 2)  LACTIC ACID, PLASMA  PROCALCITONIN  LACTIC ACID, PLASMA     EKG  none   RADIOLOGY I, Rudene Re, attending MD, have personally viewed and interpreted the images obtained during this visit as below:  X-ray with no free air, no signs of osteomyelitis.  Ultrasound of the groin concerning for enlarged lymph node   ___________________________________________________ Interpretation by Radiologist:  DG Foot Complete Right  Result Date: 03/20/2021 CLINICAL DATA:  Right foot pain ulcer to the right foot EXAM: RIGHT FOOT COMPLETE - 3+ VIEW COMPARISON:  None. FINDINGS: No fracture or malalignment. No periostitis or osseous destructive change. No soft tissue emphysema or radiopaque foreign body  IMPRESSION: No acute osseous abnormality. Electronically Signed   By: Donavan Foil M.D.   On: 03/20/2021 18:37   [place] RT LOWER EXTREM LTD SOFT TISSUE NON VASCULAR  Result Date: 03/21/2021 CLINICAL DATA:  Right groin mass EXAM: ULTRASOUND RIGHT LOWER EXTREMITY LIMITED TECHNIQUE: Ultrasound examination of the lower extremity soft tissues was performed in the area of clinical concern. COMPARISON:  None. FINDINGS: There are multiple enlarged lymph nodes in the right groin, the largest measuring 3.9 x 2.9 x 1.5 cm. IMPRESSION: Right inguinal adenopathy. Recommend clinical follow-up to ensure resolution. Electronically Signed   By: Rolm Baptise M.D.   On: 03/21/2021 01:12      PROCEDURES:  Critical Care performed: Yes, see critical care procedure note(s)  .Critical Care Performed by: Rudene Re, MD Authorized by: Rudene Re, MD   Critical care provider statement:    Critical care time (minutes):  30   Critical care time was exclusive of:  Separately billable procedures and treating other patients   Critical care was necessary to treat or prevent imminent or life-threatening deterioration of the following conditions:  Renal failure, cardiac failure, circulatory failure, sepsis, shock and metabolic crisis   Critical care was time spent personally by me on the following activities:  Development of treatment plan with patient or surrogate, discussions with consultants, evaluation of patient's response to treatment, examination of patient, ordering and review of laboratory studies, ordering and review of radiographic studies, ordering and performing treatments and interventions, pulse oximetry, re-evaluation of patient's condition, review of old charts and obtaining history from patient or surrogate   I assumed direction of critical care for this patient from another provider in my specialty: no     Care discussed with: admitting provider      IMPRESSION / MDM / [place] / ED  COURSE  I reviewed the triage vital signs and the nursing notes.  47 y.o. male with a history of diabetes who presents for evaluation of a foot ulcer.  2 weeks of progressively worsening foot ulcer with foul purulent discharge and a new large lymph node on the right groin.  Ddx: Infected diabetic foot ulcer versus osteomyelitis versus necrotizing infection versus bacteremia   Plan: CBC, CMP, lactic, procalcitonin, blood cultures, ultrasound of the groin, x-ray of the foot   MEDICATIONS GIVEN IN ED: Medications  lactated ringers infusion (has no administration in time range)  lactated ringers bolus 1,000 mL (0 mLs Intravenous Stopped 03/21/21 0358)    And  lactated ringers bolus 1,000 mL (1,000 mLs Intravenous New Bag/Given  03/21/21 0400)    And  lactated ringers bolus 1,000 mL (has no administration in time range)  acetaminophen (TYLENOL) tablet 650 mg (has no administration in time range)    Or  acetaminophen (TYLENOL) suppository 650 mg (has no administration in time range)  ondansetron (ZOFRAN) tablet 4 mg (has no administration in time range)    Or  ondansetron (ZOFRAN) injection 4 mg (has no administration in time range)  HYDROcodone-acetaminophen (NORCO/VICODIN) 5-325 MG per tablet 1-2 tablet (has no administration in time range)  vancomycin (VANCOREADY) IVPB 2000 mg/400 mL (has no administration in time range)  vancomycin (VANCOREADY) IVPB 1250 mg/250 mL (has no administration in time range)  piperacillin-tazobactam (ZOSYN) IVPB 3.375 g (has no administration in time range)  piperacillin-tazobactam (ZOSYN) IVPB 3.375 g (0 g Intravenous Stopped 03/21/21 0225)     ED COURSE: X-ray with no signs of osteomyelitis or free air.  Ultrasound confirms groin lymphadenopathy.  Patient with a low-grade temp of 99.4, initially mild tachycardic and with an enlarged lymph node distally from the site of infection therefore IV antibiotics were initiated with Zosyn.  Labs showing hyperglycemia no  evidence of DKA.  White count is normal. Lactic is normal.  Consulted the hospitalist and after discussion they accepted patient to their service.  Recommended consultation with wound clinic and podiatry in house.   Consults: Hospitalist   EMR reviewed including last visit with primary care doctor for diabetic management from June 2022       FINAL CLINICAL IMPRESSION(S) / ED DIAGNOSES   Final diagnoses:  Pelvic mass  Diabetic ulcer of right midfoot associated with type 2 diabetes mellitus, unspecified ulcer stage ([place])  Sepsis without acute organ dysfunction, due to unspecified organism [facility])     Rx / DC Orders   ED Discharge Orders     None        Note:  This document was prepared using Dragon voice recognition software and may include unintentional dictation errors.   Alfred Levins, [place], MD 03/21/21 [phone]

## 2021-03-21 NOTE — Progress Notes (Signed)
CODE SEPSIS - PHARMACY COMMUNICATION  **Broad Spectrum Antibiotics should be administered within 1 hour of Sepsis diagnosis**  Time Code Sepsis Called/Page Received:  1/22 @ 0223  Antibiotics Ordered:  Zosyn   Time of 1st antibiotic administration: Zosyn 3.375 gm IV X 1 given on 1/22 @ 0040  Additional action taken by pharmacy:   If necessary, Name of Provider/Nurse Contacted:     Swayzee Wadley D ,PharmD Clinical Pharmacist  03/21/2021  2:35 AM

## 2021-03-21 NOTE — Progress Notes (Signed)
PROGRESS NOTE  Donald Carpenter WVP:[id number] DOB: [birth year]   PCP: [facility] [place]  Patient is from: Home  DOA: 03/20/2021 LOS: 0  Chief complaints:  Chief Complaint  Patient presents with   Foot Pain     Brief Narrative / Interim history: 46 year old M with PMH of IDDM-2, diabetic right foot ulcer and tobacco use disorder presenting with right foot ulcer with foul-smelling discharge, and admitted for diabetic foot ulcer infection.  X-ray without acute osseous abnormality.  RLE [place] with right inguinal adenopathy.  Podiatry consulted.  Started on vancomycin and Zosyn and admitted.  Subjective: Seen and examined earlier this morning.  No major events this morning.  Was evaluated by podiatry and had debridement and dressing at bedside.  He denies pain or numbness.  Objective: Vitals:   03/20/21 1745 03/20/21 2119 03/21/21 0430 03/21/21 0900  BP: 129/80 135/83 132/79 (!) 151/98  Pulse: (!) 102 93 96 76  Resp: '18 14 16 16  ' Temp: 99.4 F (37.4 C)  99 F (37.2 C) 97.7 F (36.5 C)  TempSrc: Oral  Oral Oral  SpO2: 100% 100% 100% 98%  Weight:      Height:        Examination:  GENERAL: No apparent distress.  Nontoxic. HEENT: MMM.  Vision and hearing grossly intact.  NECK: Supple.  No apparent JVD.  RESP: 98% on RA.  No IWOB.  Fair aeration bilaterally. CVS:  RRR. Heart sounds normal.  ABD/GI/GU: BS+. Abd soft, NTND.  MSK/EXT:  Moves extremities.  New dressing over right foot DCI.  No notable swelling proximally. SKIN: Dressing over right foot DCI. NEURO: Awake, alert and oriented appropriately.  No apparent focal neuro deficit. PSYCH: Calm. Normal affect.   Procedures:  03/21/2021-debridement of right foot ulcer  Microbiology summarized: COVID-19 and influenza PCR nonreactive. Blood cultures NGTD.  Assessment & Plan: Diabetic right foot ulcer infection:  -S/p bedside debridement and dressing by podiatry. -Follow MRI.  -Check CRP and ESR -Continue vancomycin  and Zosyn  Uncontrolled IDDM-2 with hyperglycemia and diabetic foot ulcer: Uses Lantus 22 units daily at home. No results for input(s): HGBA1C in the last 72 hours. No results for input(s): GLUCAP in the last 168 hours. -Start SSI-moderate -NovoLog 3 units 3 times daily with meals -Basal insulin 10 units daily -Continue home statin.  Tobacco use disorder-reports smoking 10 cigarettes a day. -Encouraged tobacco cessation -Patient declined nicotine patch.   Body mass index is 21.94 kg/m.         DVT prophylaxis:  enoxaparin (LOVENOX) injection 40 mg Start: 03/21/21 1215 SCDs Start: 03/21/21 0304  Code Status: Full code Family Communication: Patient and/or RN. Available if any question.  Level of care: Med-Surg Status is: Inpatient  Remains inpatient appropriate because: Diabetic foot ulcer infection requiring IV antibiotics and further evaluation and possible surgical intervention       Consultants:  Podiatry   Sch Meds:  Scheduled Meds:  atorvastatin  20 mg Oral Daily   enoxaparin (LOVENOX) injection  40 mg Subcutaneous Q24H   insulin aspart  0-15 Units Subcutaneous TID WC   insulin aspart  0-5 Units Subcutaneous QHS   insulin aspart  3 Units Subcutaneous TID WC   insulin glargine-yfgn  10 Units Subcutaneous Daily   Continuous Infusions:  lactated ringers 150 mL/hr at 03/21/21 0631   piperacillin-tazobactam (ZOSYN)  IV Stopped (03/21/21 1032)   vancomycin     PRN Meds:.acetaminophen **OR** acetaminophen, HYDROcodone-acetaminophen, ondansetron **OR** ondansetron (ZOFRAN) IV  Antimicrobials: Anti-infectives (From admission, onward)  Start     Dose/Rate Route Frequency Ordered Stop   03/21/21 1600  vancomycin (VANCOREADY) IVPB 1250 mg/250 mL        1,250 mg 166.7 mL/hr over 90 Minutes Intravenous Every 12 hours 03/21/21 0311     03/21/21 0600  piperacillin-tazobactam (ZOSYN) IVPB 3.375 g        3.375 g 12.5 mL/hr over 240 Minutes Intravenous Every 8  hours 03/21/21 0314     03/21/21 0315  vancomycin (VANCOCIN) IVPB 1000 mg/200 mL premix  Status:  Discontinued        1,000 mg 200 mL/hr over 60 Minutes Intravenous  Once 03/21/21 0304 03/21/21 0307   03/21/21 0315  vancomycin (VANCOREADY) IVPB 2000 mg/400 mL        2,000 mg 200 mL/hr over 120 Minutes Intravenous  Once 03/21/21 0307 03/21/21 0622   03/21/21 0045  piperacillin-tazobactam (ZOSYN) IVPB 3.375 g        3.375 g 100 mL/hr over 30 Minutes Intravenous  Once 03/21/21 0034 03/21/21 0225        I have personally reviewed the following labs and images: CBC: Recent Labs  Lab 03/20/21 1748 03/21/21 0949  WBC 8.0 4.1  NEUTROABS 5.7  --   HGB 11.2* 10.7*  HCT 34.3* 32.4*  MCV 82.9 82.9  PLT 213 182   BMP &GFR Recent Labs  Lab 03/20/21 1748 03/21/21 0949  NA 133* 130*  K 4.0 4.2  CL 96* 96*  CO2 28 28  GLUCOSE 215* 260*  BUN 22* 19  CREATININE 1.21 1.07  CALCIUM 9.0 8.2*  MG  --  1.8  PHOS  --  2.9   Estimated Creatinine Clearance: 101.3 mL/min (by C-G formula based on SCr of 1.07 mg/dL). Liver & Pancreas: Recent Labs  Lab 03/20/21 1748 03/21/21 0949  AST 17  --   ALT 16  --   ALKPHOS 59  --   BILITOT 0.5  --   PROT 7.4  --   ALBUMIN 3.9 3.2*   No results for input(s): LIPASE, AMYLASE in the last 168 hours. No results for input(s): AMMONIA in the last 168 hours. Diabetic: No results for input(s): HGBA1C in the last 72 hours. No results for input(s): GLUCAP in the last 168 hours. Cardiac Enzymes: No results for input(s): CKTOTAL, CKMB, CKMBINDEX, TROPONINI in the last 168 hours. No results for input(s): PROBNP in the last 8760 hours. Coagulation Profile: No results for input(s): INR, PROTIME in the last 168 hours. Thyroid Function Tests: No results for input(s): TSH, T4TOTAL, FREET4, T3FREE, THYROIDAB in the last 72 hours. Lipid Profile: No results for input(s): CHOL, HDL, LDLCALC, TRIG, CHOLHDL, LDLDIRECT in the last 72 hours. Anemia Panel: No  results for input(s): VITAMINB12, FOLATE, FERRITIN, TIBC, IRON, RETICCTPCT in the last 72 hours. Urine analysis:    Component Value Date/Time   COLORURINE Yellow 03/29/2011 0638   APPEARANCEUR Clear 03/29/2011 0638   LABSPEC 1.032 03/29/2011 0638   PHURINE 5.0 03/29/2011 0638   GLUCOSEU >=500 03/29/2011 0638   HGBUR 1+ 03/29/2011 0638   BILIRUBINUR Negative 03/29/2011 0638   KETONESUR 2+ 03/29/2011 0638   PROTEINUR 100 mg/dL 03/29/2011 0638   NITRITE Negative 03/29/2011 0638   LEUKOCYTESUR Negative 03/29/2011 0638   Sepsis Labs: Invalid input(s): PROCALCITONIN, [place]  Microbiology: Recent Results (from the past 240 hour(s))  Culture, blood (routine x 2)     Status: None (Preliminary result)   Collection Time: 03/20/21  5:48 PM   Specimen: BLOOD  Result Value Ref  Range Status   Specimen Description BLOOD RIGHT ANTECUBITAL  Final   Special Requests   Final    BOTTLES DRAWN AEROBIC AND ANAEROBIC Blood Culture adequate volume   Culture   Final    NO GROWTH < 12 HOURS Performed at [facility], [address] [place], [place] [address]    Report Status PENDING  Incomplete  Culture, blood (routine x 2)     Status: None (Preliminary result)   Collection Time: 03/20/21  5:48 PM   Specimen: BLOOD  Result Value Ref Range Status   Specimen Description BLOOD BLOOD LEFT FOREARM  Final   Special Requests   Final    BOTTLES DRAWN AEROBIC AND ANAEROBIC Blood Culture adequate volume   Culture   Final    NO GROWTH < 12 HOURS Performed at [facility], [address], [place], [place] [address]    Report Status PENDING  Incomplete  Resp Panel by RT-PCR (Flu A&B, Covid) Nasopharyngeal Swab     Status: None   Collection Time: 03/21/21  2:52 AM   Specimen: Nasopharyngeal Swab; Nasopharyngeal(NP) swabs in vial transport medium  Result Value Ref Range Status   SARS Coronavirus 2 by RT PCR NEGATIVE NEGATIVE Final    Comment: (NOTE) SARS-CoV-2 target nucleic acids  are NOT DETECTED.  The SARS-CoV-2 RNA is generally detectable in upper respiratory specimens during the acute phase of infection. The lowest concentration of SARS-CoV-2 viral copies this assay can detect is 138 copies/mL. A negative result does not preclude SARS-Cov-2 infection and should not be used as the sole basis for treatment or other patient management decisions. A negative result may occur with  improper specimen collection/handling, submission of specimen other than nasopharyngeal swab, presence of viral mutation(s) within the areas targeted by this assay, and inadequate number of viral copies(<138 copies/mL). A negative result must be combined with clinical observations, patient history, and epidemiological information. The expected result is Negative.  Fact Sheet for Patients:  EntrepreneurPulse.com.au  Fact Sheet for Healthcare Providers:  IncredibleEmployment.be  This test is no t yet approved or cleared by the [place] FDA and  has been authorized for detection and/or diagnosis of SARS-CoV-2 by FDA under an Emergency Use Authorization (EUA). This EUA will remain  in effect (meaning this test can be used) for the duration of the COVID-19 declaration under Section 564(b)(1) of the Act, 21 U.S.C.section 360bbb-3(b)(1), unless the authorization is terminated  or revoked sooner.       Influenza A by PCR NEGATIVE NEGATIVE Final   Influenza B by PCR NEGATIVE NEGATIVE Final    Comment: (NOTE) The Xpert Xpress SARS-CoV-2/FLU/RSV plus assay is intended as an aid in the diagnosis of influenza from Nasopharyngeal swab specimens and should not be used as a sole basis for treatment. Nasal washings and aspirates are unacceptable for Xpert Xpress SARS-CoV-2/FLU/RSV testing.  Fact Sheet for Patients: EntrepreneurPulse.com.au  Fact Sheet for Healthcare Providers: IncredibleEmployment.be  This test is  not yet approved or cleared by the [place] FDA and has been authorized for detection and/or diagnosis of SARS-CoV-2 by FDA under an Emergency Use Authorization (EUA). This EUA will remain in effect (meaning this test can be used) for the duration of the COVID-19 declaration under Section 564(b)(1) of the Act, 21 U.S.C. section 360bbb-3(b)(1), unless the authorization is terminated or revoked.  Performed at [facility], [address] [place], [place] [address]     Radiology Studies: DG Foot Complete Right  Result Date: 03/20/2021 CLINICAL DATA:  Right foot pain ulcer to the right foot EXAM: RIGHT FOOT COMPLETE - 3+ VIEW COMPARISON:  None. FINDINGS: No fracture or malalignment. No periostitis or osseous destructive change. No soft tissue emphysema or radiopaque foreign body IMPRESSION: No acute osseous abnormality. Electronically Signed   By: Donavan Foil M.D.   On: 03/20/2021 18:37   [place] RT LOWER EXTREM LTD SOFT TISSUE NON VASCULAR  Result Date: 03/21/2021 CLINICAL DATA:  Right groin mass EXAM: ULTRASOUND RIGHT LOWER EXTREMITY LIMITED TECHNIQUE: Ultrasound examination of the lower extremity soft tissues was performed in the area of clinical concern. COMPARISON:  None. FINDINGS: There are multiple enlarged lymph nodes in the right groin, the largest measuring 3.9 x 2.9 x 1.5 cm. IMPRESSION: Right inguinal adenopathy. Recommend clinical follow-up to ensure resolution. Electronically Signed   By: Rolm Baptise M.D.   On: 03/21/2021 01:12    No charge service.  Admission after midnight  Aiva Miskell T. [place]  If 7PM-7AM, please contact night-coverage [email or web address] 03/21/2021, 12:09 PM

## 2021-03-21 NOTE — Consult Note (Signed)
ORTHOPAEDIC CONSULTATION  REQUESTING PHYSICIAN: Mercy Riding, MD  Chief Complaint: Right foot swelling  HPI: Donald Carpenter is a 46 y.o. male who complains of worsening swelling in his right foot.  He states over the last week or so he has noticed swelling into the foot.  He noticed a knot into the groin area as well.  Does have a longstanding history of diabetes.  Has been admitted for diabetic foot infection in the past.  He states he has maybe had some chills over the last week.  [facility]  Past Medical History:  Diagnosis Date   Diabetes mellitus without complication ([place])    History reviewed. No pertinent surgical history. Social History   Socioeconomic History   Marital status: Married    Spouse name: Not on file   Number of children: Not on file   Years of education: Not on file   Highest education level: Not on file  Occupational History   Not on file  Tobacco Use   Smoking status: Every Day    Packs/day: 0.50    Types: Cigarettes   Smokeless tobacco: Never  Substance and Sexual Activity   Alcohol use: Yes   Drug use: Not Currently   Sexual activity: Not on file  Other Topics Concern   Not on file  Social History Narrative   Not on file   Social Determinants of Health   Financial Resource Strain: Not on file  Food Insecurity: Not on file  Transportation Needs: Not on file  Physical Activity: Not on file  Stress: Not on file  Social Connections: Not on file   No family history on file. No Known Allergies Prior to Admission medications   Medication Sig Start Date End Date Taking? Authorizing Provider  blood glucose meter kit and supplies KIT Dispense based on patient and insurance preference. Use up to four times daily as directed. 06/22/20   Little Ishikawa, MD  HYDROcodone-acetaminophen (NORCO/VICODIN) 5-325 MG tablet Take 1 tablet by mouth every 4 (four) hours as needed for moderate pain. 06/22/20  06/22/21  Little Ishikawa, MD  insulin glargine (LANTUS) 100 UNIT/ML Solostar Pen Inject 10 Units into the skin daily. 06/22/20   Little Ishikawa, MD  meloxicam (MOBIC) 15 MG tablet Take 1 tablet (15 mg total) by mouth daily. 10/19/15   Cuthriell, Charline Bills, PA-C   DG Foot Complete Right  Result Date: 03/20/2021 CLINICAL DATA:  Right foot pain ulcer to the right foot EXAM: RIGHT FOOT COMPLETE - 3+ VIEW COMPARISON:  None. FINDINGS: No fracture or malalignment. No periostitis or osseous destructive change. No soft tissue emphysema or radiopaque foreign body IMPRESSION: No acute osseous abnormality. Electronically Signed   By: Donavan Foil M.D.   On: 03/20/2021 18:37   [place] RT LOWER EXTREM LTD SOFT TISSUE NON VASCULAR  Result Date: 03/21/2021 CLINICAL DATA:  Right groin mass EXAM: ULTRASOUND RIGHT LOWER EXTREMITY LIMITED TECHNIQUE: Ultrasound examination of the lower extremity soft tissues was performed in the area of clinical concern. COMPARISON:  None. FINDINGS: There are multiple enlarged lymph nodes in the right groin, the largest measuring 3.9 x 2.9 x 1.5 cm. IMPRESSION: Right inguinal adenopathy. Recommend clinical follow-up to ensure resolution. Electronically Signed   By: Rolm Baptise M.D.   On: 03/21/2021 01:12    Positive ROS: All other systems have been reviewed and were otherwise negative with the exception of those mentioned in the HPI and as above.  12 point ROS was performed.  Physical Exam: General: Alert and oriented.  No apparent distress.  Vascular:  Left foot:Dorsalis Pedis:  present Posterior Tibial:  present  Right foot: Dorsalis Pedis:  present Posterior Tibial:  present  Neuro:absent protective sensation  Derm: Left foot without ulceration. Right foot with large diffuse hyperkeratotic area with a blister formation with some dried blood in serous fluid.  Debridement performed today.  The wound did not probe deep to the dermal tissue at this point.  Clinical  picture.  Ortho/MS: Right foot with diffuse edema to the entire forefoot.  Hammertoe contractures noted.  Assessment: Diabetic foot ulceration with concern for deep infection  Plan: We will order an MRI at this point for further evaluation and clarification.  I had a long discussion with the patient in regards to possible outcomes.  If the MRI is benign and does not show any deep infection or concern for osteomyelitis we can likely treat this in the outpatient setting with routine debridement and offloading methods.  Also will need some local wound care to the area.  If the MRI is concerning for infection will need I&D and debridement and we discussed this briefly today.  Bulky sterile bandage was applied.  We will follow-up tomorrow for discussion of the MRI.  Debridement. Location plantar right foot Pre-debridement measurements: Entire surrounding nonviable hyperkeratotic tissue measures about 4 x 2 cm  Postdebridement measurements 5 x 3 cm  Debridement to dermal tissue.  Tissue remove nonviable hyperkeratotic and hemorrhagic macerated tissue      Elesa Hacker, DPM Cell [phone]   03/21/2021 9:21 AM

## 2021-03-21 NOTE — H&P (Signed)
History and Physical    Donald Carpenter PCH:[id number] DOB: 19-Aug-1975 DOA: 03/20/2021  PCP: [facility] [place]   Patient coming from: home  I have personally briefly reviewed patient's relevant medical records in [facility]  Chief Complaint: right foot ulcer  HPI: Donald Carpenter is a 46 y.o. male with medical history significant for diabetes, hospitalized in April 2022 with sepsis secondary to right diabetic foot infection, who presents to the ED with complaints of an ulcer on the bottom of the right foot with foul-smelling oozing, similar to the last time he was hospitalized.  He has had low-grade fevers.  He also feels a tender lump in his right groin.  He denies cough or shortness of breath, nausea or vomiting, abdominal pain or diarrhea and denies dysuria  ED course: Temperature 99.2 and tachycardic to 102 with otherwise normal vitals Blood work: WBC 8 with lactic acid 0.7.  Blood glucose 215  Imaging: Right foot x-ray without acute osseous abnormality Right lower extremity ultrasound with right inguinal adenopathy  Patient started on sepsis fluids and Zosyn.  Hospitalist consulted for admission.   Review of Systems: As per HPI otherwise all other systems on review of systems negative.   Assessment/Plan  Diabetic foot ulcer right foot with infection (HCC) Possible sepsis - Zosyn and vancomycin - Sepsis fluids - Podiatry consult -SCD and n.p.o. in case of procedure    Hyperglycemia due to type 2 diabetes mellitus (HCC) - Sliding scale insulin coverage  DVT prophylaxis: SCD  Code Status: full code  Family Communication:  none  Disposition Plan: Back to previous home environment Consults called: podiatry Status:At the time of admission, it appears that the appropriate admission status for this patient is INPATIENT. This is judged to be reasonable and necessary in order to provide the required intensity of service to ensure the patient's safety given the  presenting symptoms, physical exam findings, and initial radiographic and laboratory data in the context of their  Comorbid conditions.   Patient requires inpatient status due to high intensity of service, high risk for further deterioration and high frequency of surveillance required.   I certify that at the point of admission it is my clinical judgment that the patient will require inpatient hospital care spanning beyond 2 midnights     Physical Exam: Vitals:   03/20/21 1743 03/20/21 1745 03/20/21 2119  BP:  129/80 135/83  Pulse:  (!) 102 93  Resp:  18 14  Temp:  99.4 F (37.4 C)   TempSrc:  Oral   SpO2:  100% 100%  Weight: 83.9 kg    Height: '6\' 5"'  (1.956 m)     Constitutional: Alert, oriented x 3 . Not in any apparent distress HEENT:      Head: Normocephalic and atraumatic.         Eyes: PERLA, EOMI, Conjunctivae are normal. Sclera is non-icteric.       Mouth/Throat: Mucous membranes are moist.       Neck: Supple with no signs of meningismus. Cardiovascular: Regular rate and rhythm. No murmurs, gallops, or rubs. 2+ symmetrical distal pulses are present . No JVD. No  LE edema Respiratory: Respiratory effort normal .Lungs sounds clear bilaterally. No wheezes, crackles, or rhonchi.  Gastrointestinal: Soft, non tender, non distended. Positive bowel sounds.  Genitourinary: No CVA tenderness. Musculoskeletal: Right foot in boot.  No cyanosis, or erythema of extremities. Neurologic:  Face is symmetric. Moving all extremities. No gross focal neurologic deficits .  Psychiatric: Mood and affect are  appropriate     Past Medical History:  Diagnosis Date   Diabetes mellitus without complication ([place])     History reviewed. No pertinent surgical history.   reports that he has been smoking cigarettes. He has been smoking an average of .5 packs per day. He has never used smokeless tobacco. He reports current alcohol use. He reports that he does not currently use drugs.  No Known  Allergies  No family history on file.    Prior to Admission medications   Medication Sig Start Date End Date Taking? Authorizing Provider  blood glucose meter kit and supplies KIT Dispense based on patient and insurance preference. Use up to four times daily as directed. 06/22/20   Little Ishikawa, MD  HYDROcodone-acetaminophen (NORCO/VICODIN) 5-325 MG tablet Take 1 tablet by mouth every 4 (four) hours as needed for moderate pain. 06/22/20 06/22/21  Little Ishikawa, MD  insulin glargine (LANTUS) 100 UNIT/ML Solostar Pen Inject 10 Units into the skin daily. 06/22/20   Little Ishikawa, MD  meloxicam (MOBIC) 15 MG tablet Take 1 tablet (15 mg total) by mouth daily. 10/19/15   Cuthriell, Charline Bills, PA-C      Labs on Admission: I have personally reviewed following labs and imaging studies  CBC: Recent Labs  Lab 03/20/21 1748  WBC 8.0  NEUTROABS 5.7  HGB 11.2*  HCT 34.3*  MCV 82.9  PLT 292   Basic Metabolic Panel: Recent Labs  Lab 03/20/21 1748  NA 133*  K 4.0  CL 96*  CO2 28  GLUCOSE 215*  BUN 22*  CREATININE 1.21  CALCIUM 9.0   GFR: Estimated Creatinine Clearance: 89.6 mL/min (by C-G formula based on SCr of 1.21 mg/dL). Liver Function Tests: Recent Labs  Lab 03/20/21 1748  AST 17  ALT 16  ALKPHOS 59  BILITOT 0.5  PROT 7.4  ALBUMIN 3.9   No results for input(s): LIPASE, AMYLASE in the last 168 hours. No results for input(s): AMMONIA in the last 168 hours. Coagulation Profile: No results for input(s): INR, PROTIME in the last 168 hours. Cardiac Enzymes: No results for input(s): CKTOTAL, CKMB, CKMBINDEX, TROPONINI in the last 168 hours. BNP (last 3 results) No results for input(s): PROBNP in the last 8760 hours. HbA1C: No results for input(s): HGBA1C in the last 72 hours. CBG: No results for input(s): GLUCAP in the last 168 hours. Lipid Profile: No results for input(s): CHOL, HDL, LDLCALC, TRIG, CHOLHDL, LDLDIRECT in the last 72 hours. Thyroid  Function Tests: No results for input(s): TSH, T4TOTAL, FREET4, T3FREE, THYROIDAB in the last 72 hours. Anemia Panel: No results for input(s): VITAMINB12, FOLATE, FERRITIN, TIBC, IRON, RETICCTPCT in the last 72 hours. Urine analysis:    Component Value Date/Time   COLORURINE Yellow 03/29/2011 0638   APPEARANCEUR Clear 03/29/2011 0638   LABSPEC 1.032 03/29/2011 0638   PHURINE 5.0 03/29/2011 0638   GLUCOSEU >=500 03/29/2011 0638   HGBUR 1+ 03/29/2011 0638   BILIRUBINUR Negative 03/29/2011 0638   KETONESUR 2+ 03/29/2011 0638   PROTEINUR 100 mg/dL 03/29/2011 0638   NITRITE Negative 03/29/2011 0638   LEUKOCYTESUR Negative 03/29/2011 0638    Radiological Exams on Admission: DG Foot Complete Right  Result Date: 03/20/2021 CLINICAL DATA:  Right foot pain ulcer to the right foot EXAM: RIGHT FOOT COMPLETE - 3+ VIEW COMPARISON:  None. FINDINGS: No fracture or malalignment. No periostitis or osseous destructive change. No soft tissue emphysema or radiopaque foreign body IMPRESSION: No acute osseous abnormality. Electronically Signed   By: Maudie Mercury  Francoise Ceo M.D.   On: 03/20/2021 18:37   [place] RT LOWER EXTREM LTD SOFT TISSUE NON VASCULAR  Result Date: 03/21/2021 CLINICAL DATA:  Right groin mass EXAM: ULTRASOUND RIGHT LOWER EXTREMITY LIMITED TECHNIQUE: Ultrasound examination of the lower extremity soft tissues was performed in the area of clinical concern. COMPARISON:  None. FINDINGS: There are multiple enlarged lymph nodes in the right groin, the largest measuring 3.9 x 2.9 x 1.5 cm. IMPRESSION: Right inguinal adenopathy. Recommend clinical follow-up to ensure resolution. Electronically Signed   By: Rolm Baptise M.D.   On: 03/21/2021 01:12       Athena Masse MD [facility]   03/21/2021, 2:55 AM

## 2021-03-21 NOTE — Progress Notes (Signed)
Pharmacy Antibiotic Note  Donald Carpenter is a 46 y.o. male admitted on 03/20/2021 with sepsis.  Pharmacy has been consulted for Vancomycin dosing.  Plan: Zosyn 3.375 gm IV X 1 given on 1/22 @ 0040. Zosyn 3.375 gm IV Q8H EI ordered to continue on 1/22 @ 0600.   Vancomycin 2 gm IV X 1 ordered for 1/22 @ ~ 0400. Vancomycin 1250 mg IV Q12H ordered to start on 1/22 @ 1600.   AUC = 496.6 Vanc trough = 13.6 mcg/mL   Height: 6\' 5"  (195.6 cm) Weight: 83.9 kg (185 lb) IBW/kg (Calculated) : 89.1  Temp (24hrs), Avg:99.4 F (37.4 C), Min:99.4 F (37.4 C), Max:99.4 F (37.4 C)  Recent Labs  Lab 03/20/21 1748  WBC 8.0  CREATININE 1.21  LATICACIDVEN 0.7     Estimated Creatinine Clearance: 89.6 mL/min (by C-G formula based on SCr of 1.21 mg/dL).    No Known Allergies  Antimicrobials this admission:   >>    >>   Dose adjustments this admission:   Microbiology results:  BCx:   UCx:    Sputum:    MRSA PCR:   Thank you for allowing pharmacy to be a part of this patients care.  Adreana Coull D 03/21/2021 3:14 AM

## 2021-03-21 NOTE — Progress Notes (Signed)
Pt being followed by [facility] for Sepsis protocol. 

## 2021-03-21 NOTE — ED Notes (Signed)
Transport at bedside- moved pt OTF. Provided ticket to ride.

## 2021-03-21 NOTE — Progress Notes (Signed)
Pt admitted to room 224. Oriented to unit and call light system. Denies pain at present.

## 2021-03-21 NOTE — Progress Notes (Signed)
Pharmacy Antibiotic Note  Donald Carpenter is a 46 y.o. male admitted on 03/20/2021 with sepsis.  Pharmacy has been consulted for Vancomycin dosing.  Plan: Vancomycin 2 gm IV X 1 ordered for 1/22 @ ~ 0400. Vancomycin 1250 mg IV Q12H ordered to start on 1/22 @ 1600.   AUC = 496.6 Vanc trough = 13.6 mcg/mL   Height: 6\' 5"  (195.6 cm) Weight: 83.9 kg (185 lb) IBW/kg (Calculated) : 89.1  Temp (24hrs), Avg:99.4 F (37.4 C), Min:99.4 F (37.4 C), Max:99.4 F (37.4 C)  Recent Labs  Lab 03/20/21 1748  WBC 8.0  CREATININE 1.21  LATICACIDVEN 0.7    Estimated Creatinine Clearance: 89.6 mL/min (by C-G formula based on SCr of 1.21 mg/dL).    No Known Allergies  Antimicrobials this admission:   >>    >>   Dose adjustments this admission:   Microbiology results:  BCx:   UCx:    Sputum:    MRSA PCR:   Thank you for allowing pharmacy to be a part of this patients care.  Conall Vangorder D 03/21/2021 3:12 AM

## 2021-03-22 ENCOUNTER — Inpatient Hospital Stay: Payer: No Typology Code available for payment source

## 2021-03-22 DIAGNOSIS — D649 Anemia, unspecified: Secondary | ICD-10-CM

## 2021-03-22 DIAGNOSIS — F17200 Nicotine dependence, unspecified, uncomplicated: Secondary | ICD-10-CM

## 2021-03-22 DIAGNOSIS — I10 Essential (primary) hypertension: Secondary | ICD-10-CM

## 2021-03-22 DIAGNOSIS — E871 Hypo-osmolality and hyponatremia: Secondary | ICD-10-CM

## 2021-03-22 DIAGNOSIS — L97419 Non-pressure chronic ulcer of right heel and midfoot with unspecified severity: Secondary | ICD-10-CM

## 2021-03-22 DIAGNOSIS — E11621 Type 2 diabetes mellitus with foot ulcer: Principal | ICD-10-CM

## 2021-03-22 LAB — RENAL FUNCTION PANEL
Albumin: 3.2 g/dL — ABNORMAL LOW (ref 3.5–5.0)
Anion gap: 6 (ref 5–15)
BUN: 23 mg/dL — ABNORMAL HIGH (ref 6–20)
CO2: 28 mmol/L (ref 22–32)
Calcium: 8.2 mg/dL — ABNORMAL LOW (ref 8.9–10.3)
Chloride: 99 mmol/L (ref 98–111)
Creatinine, Ser: 1.07 mg/dL (ref 0.61–1.24)
GFR, Estimated: >60 mL/min (ref >60)
Glucose, Bld: 280 mg/dL — ABNORMAL HIGH (ref 70–99)
Phosphorus: 3 mg/dL (ref 2.5–4.6)
Potassium: 4.3 mmol/L (ref 3.5–5.1)
Sodium: 133 mmol/L — ABNORMAL LOW (ref 135–145)

## 2021-03-22 LAB — CBC
HCT: 31.5 % — ABNORMAL LOW (ref 39.0–52.0)
Hemoglobin: 10.4 g/dL — ABNORMAL LOW (ref 13.0–17.0)
MCH: 27 pg (ref 26.0–34.0)
MCHC: 33 g/dL (ref 30.0–36.0)
MCV: 81.8 fL (ref 80.0–100.0)
Platelets: 197 10*3/uL (ref 150–400)
RBC: 3.85 MIL/uL — ABNORMAL LOW (ref 4.22–5.81)
RDW: 12.8 % (ref 11.5–15.5)
WBC: 4.1 10*3/uL (ref 4.0–10.5)
nRBC: 0 % (ref 0.0–0.2)

## 2021-03-22 LAB — GLUCOSE, CAPILLARY
Glucose-Capillary: 262 mg/dL — ABNORMAL HIGH (ref 70–99)
Glucose-Capillary: 97 mg/dL (ref 70–99)

## 2021-03-22 LAB — HEMOGLOBIN A1C
Hgb A1c MFr Bld: 11.3 % — ABNORMAL HIGH (ref 4.8–5.6)
Mean Plasma Glucose: 278 mg/dL

## 2021-03-22 LAB — MAGNESIUM: Magnesium: 2.1 mg/dL (ref 1.7–2.4)

## 2021-03-22 LAB — PROCALCITONIN: Procalcitonin: <0.1 ng/mL

## 2021-03-22 MED ORDER — AMOXICILLIN-POT CLAVULANATE 875-125 MG PO TABS: 1.0000 | ORAL_TABLET | Freq: Two times a day (BID) | ORAL | 0 refills | Status: AC

## 2021-03-22 MED ORDER — INSULIN ASPART 100 UNIT/ML IJ SOLN: 6.0000 [IU] | Freq: Three times a day (TID) | INTRAMUSCULAR | Status: DC

## 2021-03-22 MED ORDER — INSULIN ASPART 100 UNIT/ML IJ SOLN
3.0000 [IU] | Freq: Three times a day (TID) | INTRAMUSCULAR | Status: DC
  Administered 2021-03-22: 3 [IU] via SUBCUTANEOUS
  Filled 2021-03-22: qty 1

## 2021-03-22 NOTE — Discharge Summary (Signed)
Physician Discharge Summary  Donald Carpenter DJS:[id number] DOB: [birth year] DOA: 03/20/2021  PCP: [facility]  Admit date: 03/20/2021 Discharge date: 03/22/2021 Admitted From: Home Disposition: Home Recommendations for Outpatient Follow-up:  Follow ups as below. Please obtain CBC/BMP/Mag at follow up Please follow up on the following pending results: None Home Health: Not indicated Equipment/Devices: Postop shoe Discharge Condition: Stable CODE STATUS: Full code  Follow-up Information     Samara Deist, DPM. Go on 04/07/2021.   Specialty: Podiatry Why: Foot/wound care, nails 9:15am appointment Contact information: [place] [address] [place], [facility]. Schedule an appointment as soon as possible for a visit in 1 week(s).   Specialty: General Practice Why: the pt will need to schedule appt Contact information: [place] [address] [phone]                Hospital Course: 46 year old M with PMH of IDDM-2, diabetic right foot ulcer and tobacco use disorder presenting with right foot ulcer with foul-smelling discharge, and admitted for diabetic foot ulcer infection.  X-ray without acute osseous abnormality.  RLE [place] with right inguinal adenopathy.  Podiatry consulted.  Started on vancomycin and Zosyn and admitted.   Patient underwent debridement of right foot ulcer at bedside on 03/21/2021.  MRI right foot with no acute osteomyelitis but soft tissue ulcer and subcutaneous soft tissue edema.  He is cleared for discharge by podiatry on p.o. Augmentin for 7 more days for outpatient follow-up.  He has been counseled on smoking cessation, and provided with resources.  See individual problem list below for more on hospital course.  Discharge Diagnoses:  Diabetic right foot ulcer infection: No evidence of acute osteomyelitis on x-ray and MRI.  MRI revealed soft tissue ulcer and subcutaneous soft  tissue edema.  CRP 4.0.  ESR 52.  No leukocytosis, lactic acidosis.  Pro-Cal within normal. -S/p bedside debridement and dressing by podiatry on 1/22. -Received vancomycin and Zosyn for 2 days.  Discharged on p.o. Augmentin for 7 more days per podiatry. -Podiatry provided wound care and follow-up instructions.   Uncontrolled IDDM-2 with hyperglycemia and diabetic foot ulcer: A1c 11.3% (from 14.7 about 9 months prior). -Patient to continue home insulin and statin.   Tobacco use disorder-reports smoking 10 cigarettes a day. -Encouraged tobacco cessation   Hyponatremia: Improved.  Corrects to normal for hyperglycemia   Normocytic anemia: H&H stable.   Body mass index is 24.37 kg/m.           Discharge Exam: Vitals:   03/21/21 2038 03/21/21 2046 03/22/21 0451 03/22/21 0755  BP: 138/81  (!) 141/82 (!) 145/81  Pulse: 87  72 76  Temp: 98.9 F (37.2 C)  97.7 F (36.5 C) 97.7 F (36.5 C)  Resp: '17  17 18  ' Height:  '6\' 5"'  (1.956 m)    Weight:  93.2 kg    SpO2: 100%  100% 100%  TempSrc:      BMI (Calculated):  24.36       GENERAL: No apparent distress.  Nontoxic. HEENT: MMM.  Vision and hearing grossly intact.  NECK: Supple.  No apparent JVD.  RESP: 100% on RA.  No IWOB.  Fair aeration bilaterally. CVS:  RRR. Heart sounds normal.  ABD/GI/GU: BS+. Abd soft, NTND.  MSK/EXT:  Moves extremities.  Dressing over right foot DCI.  No apparent swelling proximally. SKIN: As above NEURO: Awake and alert. Oriented appropriately.  No apparent  focal neuro deficit. PSYCH: Calm. Normal affect.   Discharge Instructions  Discharge Instructions     Call MD for:  extreme fatigue   Complete by: As directed    Call MD for:  redness, tenderness, or signs of infection (pain, swelling, redness, odor or green/yellow discharge around incision site)   Complete by: As directed    Call MD for:  severe uncontrolled pain   Complete by: As directed    Call MD for:  temperature >100.4   Complete by:  As directed    Diet - low sodium heart healthy   Complete by: As directed    Diet Carb Modified   Complete by: As directed    Discharge instructions   Complete by: As directed    It has been a pleasure taking care of you!  You were hospitalized due to an infected diabetic right foot ulcer for which you have had debridement.  We have started you on antibiotics to treat infection.  We are discharging you more antibiotics to complete treatment course.  It is very important that you take your medications as prescribed.   Please follow the instruction provided to you by podiatry for wound care, dressing and follow-up.  It is important that you quit smoking cigarettes.  You may use nicotine patch to help you quit smoking.  Nicotine patch is available over-the-counter.  You may also discuss other options to help you quit smoking with your primary care doctor. You can also talk to professional counselors at 1-800-QUIT-NOW ([phone] 498-4596) for free smoking cessation counseling.     Take care,   Discharge wound care:   Complete by: As directed    Follow instruction by podiatry   Increase activity slowly   Complete by: As directed       Allergies as of 03/22/2021       Reactions   Metformin Nausea Only        Medication List     STOP taking these medications    insulin glargine 100 UNIT/ML Solostar Pen Commonly known as: LANTUS       TAKE these medications    amoxicillin-clavulanate 875-125 MG tablet Commonly known as: Augmentin Take 1 tablet by mouth 2 (two) times daily for 7 days.   atorvastatin 20 MG tablet Commonly known as: LIPITOR Take 20 mg by mouth daily.   blood glucose meter kit and supplies Kit Dispense based on patient and insurance preference. Use up to four times daily as directed.   glucose-Vitamin C 4-0.006 GM Chew chewable tablet Chew 1 tablet by mouth once.   insulin glargine-yfgn 100 UNIT/ML Pen Commonly known as: SEMGLEE Inject 18 Units into  the skin daily.               Discharge Care Instructions  (From admission, onward)           Start     Ordered   03/22/21 0000  Discharge wound care:       Comments: Follow instruction by podiatry   03/22/21 1318            Consultations: Podiatry  Procedures/Studies: Right foot debridement at bedside by podiatry   MR FOOT RIGHT WO CONTRAST  Result Date: 03/21/2021 CLINICAL DATA:  Foot swelling, diabetic, osteomyelitis suspected EXAM: MRI OF THE RIGHT FOREFOOT WITHOUT CONTRAST TECHNIQUE: Multiplanar, multisequence MR imaging of the right was performed. No intravenous contrast was administered. COMPARISON:  Right foot x-ray 03/20/2021 FINDINGS: Bones/Joint/Cartilage No suspicious bone marrow edema or destructive bony  changes identified to suggest acute osteomyelitis. No acute fracture or dislocation identified. Ligaments Grossly unremarkable. Muscles and Tendons Diffuse edema throughout the intrinsic musculature of the foot. Tendons are intact. No evidence of tenosynovitis visualized. Soft tissues Soft tissue ulceration at the plantar aspect of the foot region of the third metatarsal head. No underlying defined fluid collection/abscess identified. Diffuse subcutaneous soft tissue edema most prominent dorsally. IMPRESSION: 1. No evidence of acute osteomyelitis. 2. Soft tissue ulcer at the plantar aspect of the foot region of the third metatarsal head. 3. Subcutaneous soft tissue edema most prominent dorsally and diffuse edema of the intrinsic musculature of the foot. Electronically Signed   By: Ofilia Neas M.D.   On: 03/21/2021 12:09   DG Foot Complete Right  Result Date: 03/20/2021 CLINICAL DATA:  Right foot pain ulcer to the right foot EXAM: RIGHT FOOT COMPLETE - 3+ VIEW COMPARISON:  None. FINDINGS: No fracture or malalignment. No periostitis or osseous destructive change. No soft tissue emphysema or radiopaque foreign body IMPRESSION: No acute osseous abnormality.  Electronically Signed   By: Donavan Foil M.D.   On: 03/20/2021 18:37   [place] RT LOWER EXTREM LTD SOFT TISSUE NON VASCULAR  Result Date: 03/21/2021 CLINICAL DATA:  Right groin mass EXAM: ULTRASOUND RIGHT LOWER EXTREMITY LIMITED TECHNIQUE: Ultrasound examination of the lower extremity soft tissues was performed in the area of clinical concern. COMPARISON:  None. FINDINGS: There are multiple enlarged lymph nodes in the right groin, the largest measuring 3.9 x 2.9 x 1.5 cm. IMPRESSION: Right inguinal adenopathy. Recommend clinical follow-up to ensure resolution. Electronically Signed   By: Rolm Baptise M.D.   On: 03/21/2021 01:12       The results of significant diagnostics from this hospitalization (including imaging, microbiology, ancillary and laboratory) are listed below for reference.     Microbiology: Recent Results (from the past 240 hour(s))  Culture, blood (routine x 2)     Status: None (Preliminary result)   Collection Time: 03/20/21  5:48 PM   Specimen: BLOOD  Result Value Ref Range Status   Specimen Description BLOOD RIGHT ANTECUBITAL  Final   Special Requests   Final    BOTTLES DRAWN AEROBIC AND ANAEROBIC Blood Culture adequate volume   Culture   Final    NO GROWTH 2 DAYS Performed at [facility], [address], [place], [place] [address]    Report Status PENDING  Incomplete  Culture, blood (routine x 2)     Status: None (Preliminary result)   Collection Time: 03/20/21  5:48 PM   Specimen: BLOOD  Result Value Ref Range Status   Specimen Description BLOOD BLOOD LEFT FOREARM  Final   Special Requests   Final    BOTTLES DRAWN AEROBIC AND ANAEROBIC Blood Culture adequate volume   Culture   Final    NO GROWTH 2 DAYS Performed at [facility], [address], [place], [place] [address]    Report Status PENDING  Incomplete  Resp Panel by RT-PCR (Flu A&B, Covid) Nasopharyngeal Swab     Status: None   Collection Time: 03/21/21  2:52 AM   Specimen:  Nasopharyngeal Swab; Nasopharyngeal(NP) swabs in vial transport medium  Result Value Ref Range Status   SARS Coronavirus 2 by RT PCR NEGATIVE NEGATIVE Final    Comment: (NOTE) SARS-CoV-2 target nucleic acids are NOT DETECTED.  The SARS-CoV-2 RNA is generally detectable in upper respiratory specimens during the acute phase of infection. The lowest concentration of SARS-CoV-2 viral copies this assay can  detect is 138 copies/mL. A negative result does not preclude SARS-Cov-2 infection and should not be used as the sole basis for treatment or other patient management decisions. A negative result may occur with  improper specimen collection/handling, submission of specimen other than nasopharyngeal swab, presence of viral mutation(s) within the areas targeted by this assay, and inadequate number of viral copies(<138 copies/mL). A negative result must be combined with clinical observations, patient history, and epidemiological information. The expected result is Negative.  Fact Sheet for Patients:  EntrepreneurPulse.com.au  Fact Sheet for Healthcare Providers:  IncredibleEmployment.be  This test is no t yet approved or cleared by the [place] FDA and  has been authorized for detection and/or diagnosis of SARS-CoV-2 by FDA under an Emergency Use Authorization (EUA). This EUA will remain  in effect (meaning this test can be used) for the duration of the COVID-19 declaration under Section 564(b)(1) of the Act, 21 U.S.C.section 360bbb-3(b)(1), unless the authorization is terminated  or revoked sooner.       Influenza A by PCR NEGATIVE NEGATIVE Final   Influenza B by PCR NEGATIVE NEGATIVE Final    Comment: (NOTE) The Xpert Xpress SARS-CoV-2/FLU/RSV plus assay is intended as an aid in the diagnosis of influenza from Nasopharyngeal swab specimens and should not be used as a sole basis for treatment. Nasal washings and aspirates are unacceptable for  Xpert Xpress SARS-CoV-2/FLU/RSV testing.  Fact Sheet for Patients: EntrepreneurPulse.com.au  Fact Sheet for Healthcare Providers: IncredibleEmployment.be  This test is not yet approved or cleared by the [place] FDA and has been authorized for detection and/or diagnosis of SARS-CoV-2 by FDA under an Emergency Use Authorization (EUA). This EUA will remain in effect (meaning this test can be used) for the duration of the COVID-19 declaration under Section 564(b)(1) of the Act, 21 U.S.C. section 360bbb-3(b)(1), unless the authorization is terminated or revoked.  Performed at [facility], [address] [place], [place] [address]      Labs:  CBC: Recent Labs  Lab 03/20/21 1748 03/21/21 0949 03/22/21 0443  WBC 8.0 4.1 4.1  NEUTROABS 5.7  --   --   HGB 11.2* 10.7* 10.4*  HCT 34.3* 32.4* 31.5*  MCV 82.9 82.9 81.8  PLT 213 182 197   BMP &GFR Recent Labs  Lab 03/20/21 1748 03/21/21 0949 03/22/21 0443  NA 133* 130* 133*  K 4.0 4.2 4.3  CL 96* 96* 99  CO2 '28 28 28  ' GLUCOSE 215* 260* 280*  BUN 22* 19 23*  CREATININE 1.21 1.07 1.07  CALCIUM 9.0 8.2* 8.2*  MG  --  1.8 2.1  PHOS  --  2.9 3.0   Estimated Creatinine Clearance: 107.6 mL/min (by C-G formula based on SCr of 1.07 mg/dL). Liver & Pancreas: Recent Labs  Lab 03/20/21 1748 03/21/21 0949 03/22/21 0443  AST 17  --   --   ALT 16  --   --   ALKPHOS 59  --   --   BILITOT 0.5  --   --   PROT 7.4  --   --   ALBUMIN 3.9 3.2* 3.2*   No results for input(s): LIPASE, AMYLASE in the last 168 hours. No results for input(s): AMMONIA in the last 168 hours. Diabetic: Recent Labs    03/21/21 0949  HGBA1C 11.3*   Recent Labs  Lab 03/21/21 1606 03/21/21 2158 03/22/21 0753 03/22/21 1131  GLUCAP 269* 137* 262* 97   Cardiac Enzymes: No results for input(s): CKTOTAL, CKMB, CKMBINDEX, TROPONINI in the  last 168 hours. No results for input(s): PROBNP in the last  8760 hours. Coagulation Profile: No results for input(s): INR, PROTIME in the last 168 hours. Thyroid Function Tests: No results for input(s): TSH, T4TOTAL, FREET4, T3FREE, THYROIDAB in the last 72 hours. Lipid Profile: No results for input(s): CHOL, HDL, LDLCALC, TRIG, CHOLHDL, LDLDIRECT in the last 72 hours. Anemia Panel: No results for input(s): VITAMINB12, FOLATE, FERRITIN, TIBC, IRON, RETICCTPCT in the last 72 hours. Urine analysis:    Component Value Date/Time   COLORURINE Yellow 03/29/2011 0638   APPEARANCEUR Clear 03/29/2011 0638   LABSPEC 1.032 03/29/2011 0638   PHURINE 5.0 03/29/2011 0638   GLUCOSEU >=500 03/29/2011 0638   HGBUR 1+ 03/29/2011 0638   BILIRUBINUR Negative 03/29/2011 0638   KETONESUR 2+ 03/29/2011 0638   PROTEINUR 100 mg/dL 03/29/2011 0638   NITRITE Negative 03/29/2011 0638   LEUKOCYTESUR Negative 03/29/2011 0638   Sepsis Labs: Invalid input(s): PROCALCITONIN, LACTICIDVEN   Time coordinating discharge: 45 minutes  SIGNED:  Mercy Riding, MD  [facility] 03/22/2021, 1:40 PM

## 2021-03-22 NOTE — Progress Notes (Signed)
Discharge instructions reviewed with the patient and his wife. IV removed. Wheelchair to take patient to waiting car

## 2021-03-22 NOTE — Progress Notes (Signed)
Orthopedic progress  REQUESTING PHYSICIAN: Mercy Riding, MD  Chief Complaint: Right foot swelling  HPI: Donald Carpenter is a 46 y.o. male who presents resting in bed comfortably with surgical shoe on and dressing intact.  Patient notes no issues today or overnight problems.  Patient is feeling much better at this time.  Past Medical History:  Diagnosis Date   Diabetes mellitus without complication ([place])    History reviewed. No pertinent surgical history. Social History   Socioeconomic History   Marital status: Married    Spouse name: Not on file   Number of children: Not on file   Years of education: Not on file   Highest education level: Not on file  Occupational History   Not on file  Tobacco Use   Smoking status: Every Day    Packs/day: 0.50    Types: Cigarettes   Smokeless tobacco: Never  Substance and Sexual Activity   Alcohol use: Yes   Drug use: Not Currently   Sexual activity: Not on file  Other Topics Concern   Not on file  Social History Narrative   Not on file   Social Determinants of Health   Financial Resource Strain: Not on file  Food Insecurity: Not on file  Transportation Needs: Not on file  Physical Activity: Not on file  Stress: Not on file  Social Connections: Not on file   History reviewed. No pertinent family history. Allergies  Allergen Reactions   Metformin Nausea Only   Prior to Admission medications   Medication Sig Start Date End Date Taking? Authorizing Provider  blood glucose meter kit and supplies KIT Dispense based on patient and insurance preference. Use up to four times daily as directed. 06/22/20   Little Ishikawa, MD  HYDROcodone-acetaminophen (NORCO/VICODIN) 5-325 MG tablet Take 1 tablet by mouth every 4 (four) hours as needed for moderate pain. 06/22/20 06/22/21  Little Ishikawa, MD  insulin glargine (LANTUS) 100 UNIT/ML Solostar Pen Inject 10 Units into the skin daily. 06/22/20   Little Ishikawa, MD  meloxicam  (MOBIC) 15 MG tablet Take 1 tablet (15 mg total) by mouth daily. 10/19/15   Cuthriell, Charline Bills, PA-C   MR FOOT RIGHT WO CONTRAST  Result Date: 03/21/2021 CLINICAL DATA:  Foot swelling, diabetic, osteomyelitis suspected EXAM: MRI OF THE RIGHT FOREFOOT WITHOUT CONTRAST TECHNIQUE: Multiplanar, multisequence MR imaging of the right was performed. No intravenous contrast was administered. COMPARISON:  Right foot x-ray 03/20/2021 FINDINGS: Bones/Joint/Cartilage No suspicious bone marrow edema or destructive bony changes identified to suggest acute osteomyelitis. No acute fracture or dislocation identified. Ligaments Grossly unremarkable. Muscles and Tendons Diffuse edema throughout the intrinsic musculature of the foot. Tendons are intact. No evidence of tenosynovitis visualized. Soft tissues Soft tissue ulceration at the plantar aspect of the foot region of the third metatarsal head. No underlying defined fluid collection/abscess identified. Diffuse subcutaneous soft tissue edema most prominent dorsally. IMPRESSION: 1. No evidence of acute osteomyelitis. 2. Soft tissue ulcer at the plantar aspect of the foot region of the third metatarsal head. 3. Subcutaneous soft tissue edema most prominent dorsally and diffuse edema of the intrinsic musculature of the foot. Electronically Signed   By: Ofilia Neas M.D.   On: 03/21/2021 12:09   DG Foot Complete Right  Result Date: 03/20/2021 CLINICAL DATA:  Right foot pain ulcer to the right foot EXAM: RIGHT FOOT COMPLETE - 3+ VIEW COMPARISON:  None. FINDINGS: No fracture or malalignment. No periostitis or osseous destructive change. No soft tissue emphysema  or radiopaque foreign body IMPRESSION: No acute osseous abnormality. Electronically Signed   By: Donavan Foil M.D.   On: 03/20/2021 18:37   [place] RT LOWER EXTREM LTD SOFT TISSUE NON VASCULAR  Result Date: 03/21/2021 CLINICAL DATA:  Right groin mass EXAM: ULTRASOUND RIGHT LOWER EXTREMITY LIMITED TECHNIQUE:  Ultrasound examination of the lower extremity soft tissues was performed in the area of clinical concern. COMPARISON:  None. FINDINGS: There are multiple enlarged lymph nodes in the right groin, the largest measuring 3.9 x 2.9 x 1.5 cm. IMPRESSION: Right inguinal adenopathy. Recommend clinical follow-up to ensure resolution. Electronically Signed   By: Rolm Baptise M.D.   On: 03/21/2021 01:12    Positive ROS: All other systems have been reviewed and were otherwise negative with the exception of those mentioned in the HPI and as above.  12 point ROS was performed.  Physical Exam: General: Alert and oriented.  No apparent distress.  Vascular:  Left foot:Dorsalis Pedis:  present Posterior Tibial:  present  Right foot: Dorsalis Pedis:  present Posterior Tibial:  present  Neuro:absent protective sensation  Derm: Left foot without ulceration. Ulcerated areas of the third metatarsal phalangeal joint right foot, measures approximately 1.2 cm x 0.4 cm x 0.1 cm, mild hyperkeratotic buildup around the periphery, no associated erythema or edema, minimal serous drainage.  Wound bed appears to be granular.  Nails x10 thickened, elongated, dystrophic and brittle with subungual debris.  Skin appears to be somewhat thin and atrophic to both feet.  Ortho/MS: Right foot with diffuse edema to the entire forefoot.  Hammertoe contractures noted.  Assessment: 1.  Diabetic foot ulceration right foot subthird metatarsal phalangeal joint 2.  Onychomycosis  Plan: -Patient seen and examined. -X-ray and MRI imaging reviewed.  Does not reveal any deep infection present. -Labs reviewed.  White blood cell count within normal limits. -Clinically right foot appears to be very stable at this time with no signs of infection present.  Small diabetic foot ulceration subthird metatarsal phalange joint right foot appears to be stable. -Dressing changed today with Betadine wet-to-dry dressing.  Patient instructed on every  other day dressing changes.  Patient continue with usage of surgical shoe and try and put weight on the heel when ambulating to stay off the forefoot.  Patient try to stay at the right foot as much possible.  Discussed dressing changes with patient and family member at bedside today. -Recommend discharge on oral antibiotics for the next 7 days, Augmentin. -ABIs pending.  Podiatry team to sign off on the patient at this time.  Discharge instructions placed in chart follow-up with Dr. Vickki Muff within 1 week of discharge date.  Caroline More, DPM    03/22/2021 12:58 PM

## 2021-03-22 NOTE — Discharge Instructions (Signed)
Podiatry discharge instructions: 1.  Remove dressing to right foot every other day and cleanse right foot with warm soapy water and dry clean.  Apply Betadine paint to the wound on the bottom of the right foot followed by 4 x 4 gauze, ABD, gauze roll, Ace wrap.  Change also for loosening or saturation. 2.  Continue ambulation in the surgical shoe to the right foot with heel contact and try to stay off the forefoot area in the area of the sore.  Try to stay off the right foot is much as possible. 3.  Take antibiotics as prescribed until gone. 4.  Follow-up with Dr. Ether Griffins within 1-2 weeks of discharge date.

## 2021-03-22 NOTE — Progress Notes (Signed)
PROGRESS NOTE  Donald Carpenter OVA:[id number] DOB: 01-16-1976   PCP: [facility] [place]  Patient is from: Home  DOA: 03/20/2021 LOS: 1  Chief complaints:  Chief Complaint  Patient presents with   Foot Pain     Brief Narrative / Interim history: 46 year old M with PMH of IDDM-2, diabetic right foot ulcer and tobacco use disorder presenting with right foot ulcer with foul-smelling discharge, and admitted for diabetic foot ulcer infection.  X-ray without acute osseous abnormality.  RLE [place] with right inguinal adenopathy.  Podiatry consulted.  Started on vancomycin and Zosyn and admitted.  MRI right foot with no acute osteomyelitis but soft tissue ulcer and subcutaneous soft tissue edema.   Subjective: Seen and examined earlier this morning.  No major events overnight of this morning.  No complaints.  He denies pain, numbness or tingling.  Objective: Vitals:   03/21/21 2038 03/21/21 2046 03/22/21 0451 03/22/21 0755  BP: 138/81  (!) 141/82 (!) 145/81  Pulse: 87  72 76  Resp: '17  17 18  ' Temp: 98.9 F (37.2 C)  97.7 F (36.5 C) 97.7 F (36.5 C)  TempSrc:      SpO2: 100%  100% 100%  Weight:  93.2 kg    Height:  '6\' 5"'  (1.956 m)      Examination:  GENERAL: No apparent distress.  Nontoxic. HEENT: MMM.  Vision and hearing grossly intact.  NECK: Supple.  No apparent JVD.  RESP: 100% on RA.  No IWOB.  Fair aeration bilaterally. CVS:  RRR. Heart sounds normal.  ABD/GI/GU: BS+. Abd soft, NTND.  MSK/EXT:  Moves extremities.  Dressing over right foot DCI.  No apparent swelling proximally. SKIN: As above NEURO: Awake and alert. Oriented appropriately.  No apparent focal neuro deficit. PSYCH: Calm. Normal affect.   Procedures:  03/21/2021-debridement of right foot ulcer  Microbiology summarized: COVID-19 and influenza PCR nonreactive. Blood cultures NGTD.  Assessment & Plan: Diabetic right foot ulcer infection: No evidence of acute osteomyelitis on x-ray and MRI.  MRI  revealed soft tissue ulcer and subcutaneous soft tissue edema.  CRP 4.0.  ESR 52.  No leukocytosis, lactic acidosis.  Pro-Cal within normal. -S/p bedside debridement and dressing by podiatry on 1/22. -Check CRP and ESR -Continue vancomycin and Zosyn -Tissue culture could help guide antibiotic therapy. -ID consult  Uncontrolled IDDM-2 with hyperglycemia and diabetic foot ulcer: Uses Lantus 22 units daily at home. Recent Labs  Lab 03/21/21 1606 03/21/21 2158 03/22/21 0753 03/22/21 1131  GLUCAP 269* 137* 262* 97  -Continue SSI-moderate -Continue NovoLog 3 units 3 times daily -Continue basal insulin 10 units daily -Continue statin  Tobacco use disorder-reports smoking 10 cigarettes a day. -Encouraged tobacco cessation -Patient declined nicotine patch.  Hyponatremia: Improved.  Corrects to normal for hyperglycemia  Normocytic anemia: H&H stable.  Body mass index is 24.37 kg/m.         DVT prophylaxis:  enoxaparin (LOVENOX) injection 40 mg Start: 03/21/21 2200 SCDs Start: 03/21/21 0304  Code Status: Full code Family Communication: Patient and/or RN. Available if any question.  Level of care: Med-Surg Status is: Inpatient  Remains inpatient appropriate because: Diabetic foot ulcer infection requiring IV antibiotics and further evaluation and possible surgical intervention       Consultants:  Podiatry Infectious disease   Sch Meds:  Scheduled Meds:  atorvastatin  20 mg Oral Daily   enoxaparin (LOVENOX) injection  40 mg Subcutaneous Q24H   insulin aspart  0-15 Units Subcutaneous TID WC   insulin aspart  0-5 Units Subcutaneous QHS   insulin aspart  3 Units Subcutaneous TID WC   insulin glargine-yfgn  10 Units Subcutaneous Daily   Continuous Infusions:  piperacillin-tazobactam (ZOSYN)  IV 3.375 g (03/22/21 0703)   vancomycin 1,250 mg (03/22/21 0447)   PRN Meds:.acetaminophen **OR** acetaminophen, HYDROcodone-acetaminophen, ondansetron **OR** ondansetron  (ZOFRAN) IV  Antimicrobials: Anti-infectives (From admission, onward)    Start     Dose/Rate Route Frequency Ordered Stop   03/21/21 1600  vancomycin (VANCOREADY) IVPB 1250 mg/250 mL        1,250 mg 166.7 mL/hr over 90 Minutes Intravenous Every 12 hours 03/21/21 0311     03/21/21 0600  piperacillin-tazobactam (ZOSYN) IVPB 3.375 g        3.375 g 12.5 mL/hr over 240 Minutes Intravenous Every 8 hours 03/21/21 0314     03/21/21 0315  vancomycin (VANCOCIN) IVPB 1000 mg/200 mL premix  Status:  Discontinued        1,000 mg 200 mL/hr over 60 Minutes Intravenous  Once 03/21/21 0304 03/21/21 0307   03/21/21 0315  vancomycin (VANCOREADY) IVPB 2000 mg/400 mL        2,000 mg 200 mL/hr over 120 Minutes Intravenous  Once 03/21/21 0307 03/21/21 0622   03/21/21 0045  piperacillin-tazobactam (ZOSYN) IVPB 3.375 g        3.375 g 100 mL/hr over 30 Minutes Intravenous  Once 03/21/21 0034 03/21/21 0225        I have personally reviewed the following labs and images: CBC: Recent Labs  Lab 03/20/21 1748 03/21/21 0949 03/22/21 0443  WBC 8.0 4.1 4.1  NEUTROABS 5.7  --   --   HGB 11.2* 10.7* 10.4*  HCT 34.3* 32.4* 31.5*  MCV 82.9 82.9 81.8  PLT 213 182 197   BMP &GFR Recent Labs  Lab 03/20/21 1748 03/21/21 0949 03/22/21 0443  NA 133* 130* 133*  K 4.0 4.2 4.3  CL 96* 96* 99  CO2 '28 28 28  ' GLUCOSE 215* 260* 280*  BUN 22* 19 23*  CREATININE 1.21 1.07 1.07  CALCIUM 9.0 8.2* 8.2*  MG  --  1.8 2.1  PHOS  --  2.9 3.0   Estimated Creatinine Clearance: 107.6 mL/min (by C-G formula based on SCr of 1.07 mg/dL). Liver & Pancreas: Recent Labs  Lab 03/20/21 1748 03/21/21 0949 03/22/21 0443  AST 17  --   --   ALT 16  --   --   ALKPHOS 59  --   --   BILITOT 0.5  --   --   PROT 7.4  --   --   ALBUMIN 3.9 3.2* 3.2*   No results for input(s): LIPASE, AMYLASE in the last 168 hours. No results for input(s): AMMONIA in the last 168 hours. Diabetic: No results for input(s): HGBA1C in the last  72 hours. Recent Labs  Lab 03/21/21 1606 03/21/21 2158 03/22/21 0753 03/22/21 1131  GLUCAP 269* 137* 262* 97   Cardiac Enzymes: No results for input(s): CKTOTAL, CKMB, CKMBINDEX, TROPONINI in the last 168 hours. No results for input(s): PROBNP in the last 8760 hours. Coagulation Profile: No results for input(s): INR, PROTIME in the last 168 hours. Thyroid Function Tests: No results for input(s): TSH, T4TOTAL, FREET4, T3FREE, THYROIDAB in the last 72 hours. Lipid Profile: No results for input(s): CHOL, HDL, LDLCALC, TRIG, CHOLHDL, LDLDIRECT in the last 72 hours. Anemia Panel: No results for input(s): VITAMINB12, FOLATE, FERRITIN, TIBC, IRON, RETICCTPCT in the last 72 hours. Urine analysis:    Component Value Date/Time  COLORURINE Yellow 03/29/2011 0638   APPEARANCEUR Clear 03/29/2011 0638   LABSPEC 1.032 03/29/2011 0638   PHURINE 5.0 03/29/2011 0638   GLUCOSEU >=500 03/29/2011 0638   HGBUR 1+ 03/29/2011 0638   BILIRUBINUR Negative 03/29/2011 0638   KETONESUR 2+ 03/29/2011 0638   PROTEINUR 100 mg/dL 03/29/2011 0638   NITRITE Negative 03/29/2011 0638   LEUKOCYTESUR Negative 03/29/2011 0638   Sepsis Labs: Invalid input(s): PROCALCITONIN, [place]  Microbiology: Recent Results (from the past 240 hour(s))  Culture, blood (routine x 2)     Status: None (Preliminary result)   Collection Time: 03/20/21  5:48 PM   Specimen: BLOOD  Result Value Ref Range Status   Specimen Description BLOOD RIGHT ANTECUBITAL  Final   Special Requests   Final    BOTTLES DRAWN AEROBIC AND ANAEROBIC Blood Culture adequate volume   Culture   Final    NO GROWTH 2 DAYS Performed at [facility], [address] [place], [place] [address]    Report Status PENDING  Incomplete  Culture, blood (routine x 2)     Status: None (Preliminary result)   Collection Time: 03/20/21  5:48 PM   Specimen: BLOOD  Result Value Ref Range Status   Specimen Description BLOOD BLOOD LEFT FOREARM  Final    Special Requests   Final    BOTTLES DRAWN AEROBIC AND ANAEROBIC Blood Culture adequate volume   Culture   Final    NO GROWTH 2 DAYS Performed at [facility], [address], [place], [place] [address]    Report Status PENDING  Incomplete  Resp Panel by RT-PCR (Flu A&B, Covid) Nasopharyngeal Swab     Status: None   Collection Time: 03/21/21  2:52 AM   Specimen: Nasopharyngeal Swab; Nasopharyngeal(NP) swabs in vial transport medium  Result Value Ref Range Status   SARS Coronavirus 2 by RT PCR NEGATIVE NEGATIVE Final    Comment: (NOTE) SARS-CoV-2 target nucleic acids are NOT DETECTED.  The SARS-CoV-2 RNA is generally detectable in upper respiratory specimens during the acute phase of infection. The lowest concentration of SARS-CoV-2 viral copies this assay can detect is 138 copies/mL. A negative result does not preclude SARS-Cov-2 infection and should not be used as the sole basis for treatment or other patient management decisions. A negative result may occur with  improper specimen collection/handling, submission of specimen other than nasopharyngeal swab, presence of viral mutation(s) within the areas targeted by this assay, and inadequate number of viral copies(<138 copies/mL). A negative result must be combined with clinical observations, patient history, and epidemiological information. The expected result is Negative.  Fact Sheet for Patients:  EntrepreneurPulse.com.au  Fact Sheet for Healthcare Providers:  IncredibleEmployment.be  This test is no t yet approved or cleared by the [place] FDA and  has been authorized for detection and/or diagnosis of SARS-CoV-2 by FDA under an Emergency Use Authorization (EUA). This EUA will remain  in effect (meaning this test can be used) for the duration of the COVID-19 declaration under Section 564(b)(1) of the Act, 21 U.S.C.section 360bbb-3(b)(1), unless the authorization is terminated   or revoked sooner.       Influenza A by PCR NEGATIVE NEGATIVE Final   Influenza B by PCR NEGATIVE NEGATIVE Final    Comment: (NOTE) The Xpert Xpress SARS-CoV-2/FLU/RSV plus assay is intended as an aid in the diagnosis of influenza from Nasopharyngeal swab specimens and should not be used as a sole basis for treatment. Nasal washings and aspirates are unacceptable for Xpert Xpress SARS-CoV-2/FLU/RSV testing.  Fact Sheet for Patients: EntrepreneurPulse.com.au  Fact Sheet for Healthcare Providers: IncredibleEmployment.be  This test is not yet approved or cleared by the [place] FDA and has been authorized for detection and/or diagnosis of SARS-CoV-2 by FDA under an Emergency Use Authorization (EUA). This EUA will remain in effect (meaning this test can be used) for the duration of the COVID-19 declaration under Section 564(b)(1) of the Act, 21 U.S.C. section 360bbb-3(b)(1), unless the authorization is terminated or revoked.  Performed at [facility], [address] [place], [place] [address]     Radiology Studies: No results found.   Johnel Yielding T. [place]  If 7PM-7AM, please contact night-coverage [email or web address] 03/22/2021, 12:10 PM

## 2021-03-22 NOTE — Progress Notes (Signed)
Inpatient Diabetes Program Recommendations  AACE/ADA: New Consensus Statement on Inpatient Glycemic Control  Glucose Target Ranges:  Prepandial: less than 140 mg/dL      Peak postprandial:   less than 180 mg/dL (1-2 hours)      Critically ill patients:  140 - 180 mg/dL    Latest Reference Range & Units 03/22/21 07:53  Glucose-Capillary 70 - 99 mg/dL 417 (H)    Latest Reference Range & Units 03/21/21 16:06 03/21/21 21:58  Glucose-Capillary 70 - 99 mg/dL 408 (H) 144 (H)   Review of Glycemic Control  Diabetes history: DM2 Outpatient Diabetes medications: Semglee 18 units daily Current orders for Inpatient glycemic control: Semglee 10 units daily, Novolog 0-15 units TID with meals, Novolog 0-5 units QHS, Novolog 6 units TID with meals  Inpatient Diabetes Program Recommendations:    Insulin: Please consider increasing Semglee to 13 units daily.  NOTE: Patient admitted with foot ulcer. Was last inpatient 06/21/20-06/22/20 and diabetes coordinator spoke with patient on 06/22/20 during that admission. Noted in Care Everywhere note on 03/11/21 by [facility] G[facility] (pharmacy outpatient medication management note) which notes patient reported taking Semglee 20 units and was asked to increase to 22 units QHS. Current home medication list has Semglee 18 units daily.  Thanks, Orlando Penner, RN, MSN, CDE Diabetes Coordinator Inpatient Diabetes Program [phone] (Team Pager from 8am to 5pm)

## 2021-03-25 LAB — CULTURE, BLOOD (ROUTINE X 2)
Culture: NO GROWTH
Culture: NO GROWTH
Special Requests: ADEQUATE
Special Requests: ADEQUATE

## 2021-06-06 ENCOUNTER — Other Ambulatory Visit: Payer: Self-pay

## 2021-06-06 ENCOUNTER — Emergency Department
Admission: EM | Admit: 2021-06-06 | Discharge: 2021-06-07 | Disposition: A | Discharge status: Home / Self Care | Payer: No Typology Code available for payment source | Attending: Emergency Medicine | Admitting: Emergency Medicine

## 2021-06-06 DIAGNOSIS — R944 Abnormal results of kidney function studies: Secondary | ICD-10-CM | POA: Diagnosis not present

## 2021-06-06 DIAGNOSIS — R112 Nausea with vomiting, unspecified: Secondary | ICD-10-CM | POA: Diagnosis not present

## 2021-06-06 DIAGNOSIS — E119 Type 2 diabetes mellitus without complications: Secondary | ICD-10-CM | POA: Insufficient documentation

## 2021-06-06 DIAGNOSIS — R1110 Vomiting, unspecified: Secondary | ICD-10-CM | POA: Diagnosis present

## 2021-06-06 LAB — CBC WITH DIFFERENTIAL/PLATELET
Abs Immature Granulocytes: 0.01 10*3/uL (ref 0.00–0.07)
Basophils Absolute: 0 10*3/uL (ref 0.0–0.1)
Basophils Relative: 1 %
Eosinophils Absolute: 0.1 10*3/uL (ref 0.0–0.5)
Eosinophils Relative: 1 %
HCT: 40.9 % (ref 39.0–52.0)
Hemoglobin: 12.8 g/dL — ABNORMAL LOW (ref 13.0–17.0)
Immature Granulocytes: 0 %
Lymphocytes Relative: 28 %
Lymphs Abs: 1.6 10*3/uL (ref 0.7–4.0)
MCH: 26.1 pg (ref 26.0–34.0)
MCHC: 31.3 g/dL (ref 30.0–36.0)
MCV: 83.3 fL (ref 80.0–100.0)
Monocytes Absolute: 0.3 10*3/uL (ref 0.1–1.0)
Monocytes Relative: 6 %
Neutro Abs: 3.5 10*3/uL (ref 1.7–7.7)
Neutrophils Relative %: 64 %
Platelets: 241 10*3/uL (ref 150–400)
RBC: 4.91 MIL/uL (ref 4.22–5.81)
RDW: 13.9 % (ref 11.5–15.5)
WBC: 5.5 10*3/uL (ref 4.0–10.5)
nRBC: 0 % (ref 0.0–0.2)

## 2021-06-06 LAB — COMPREHENSIVE METABOLIC PANEL
ALT: 18 U/L (ref 0–44)
AST: 24 U/L (ref 15–41)
Albumin: 4.3 g/dL (ref 3.5–5.0)
Alkaline Phosphatase: 61 U/L (ref 38–126)
Anion gap: 9 (ref 5–15)
BUN: 26 mg/dL — ABNORMAL HIGH (ref 6–20)
CO2: 29 mmol/L (ref 22–32)
Calcium: 9.2 mg/dL (ref 8.9–10.3)
Chloride: 99 mmol/L (ref 98–111)
Creatinine, Ser: 1.34 mg/dL — ABNORMAL HIGH (ref 0.61–1.24)
GFR, Estimated: >60 mL/min (ref >60)
Glucose, Bld: 216 mg/dL — ABNORMAL HIGH (ref 70–99)
Potassium: 4.2 mmol/L (ref 3.5–5.1)
Sodium: 137 mmol/L (ref 135–145)
Total Bilirubin: 0.7 mg/dL (ref 0.3–1.2)
Total Protein: 8.3 g/dL — ABNORMAL HIGH (ref 6.5–8.1)

## 2021-06-06 LAB — LIPASE, BLOOD: Lipase: 34 U/L (ref 11–51)

## 2021-06-06 MED ORDER — ONDANSETRON HCL 4 MG PO TABS: 4.0000 mg | ORAL_TABLET | Freq: Three times a day (TID) | ORAL | 0 refills | Status: DC | PRN

## 2021-06-06 MED ORDER — ONDANSETRON HCL 4 MG/2ML IJ SOLN
4.0000 mg | Freq: Once | INTRAMUSCULAR | Status: AC
Start: 2021-06-06 — End: 2021-06-06
  Administered 2021-06-06: 4 mg via INTRAVENOUS
  Filled 2021-06-06: qty 2

## 2021-06-06 MED ORDER — METOCLOPRAMIDE HCL 10 MG PO TABS: 10.0000 mg | ORAL_TABLET | Freq: Four times a day (QID) | ORAL | 0 refills | Status: AC | PRN

## 2021-06-06 MED ORDER — SODIUM CHLORIDE 0.9 % IV BOLUS
1000.0000 mL | Freq: Once | INTRAVENOUS | Status: AC
  Administered 2021-06-06: 1000 mL via INTRAVENOUS

## 2021-06-06 MED ORDER — HALOPERIDOL LACTATE 5 MG/ML IJ SOLN
5.0000 mg | Freq: Once | INTRAMUSCULAR | Status: AC
Start: 2021-06-06 — End: 2021-06-06
  Administered 2021-06-06: 5 mg via INTRAVENOUS
  Filled 2021-06-06: qty 1

## 2021-06-06 NOTE — ED Triage Notes (Addendum)
Pt arrived via POV with reports of 2 weeks of vomiting. Denies abd pain at this time. Pt reports some epigastric pain. ?

## 2021-06-06 NOTE — ED Notes (Signed)
Pt tolerated ginger ale well. No c/o nausea and did not vomit.  ?

## 2021-06-06 NOTE — ED Provider Notes (Signed)
? ?[facility] ?Provider Note ? ? ? Event Date/Time  ? First MD Initiated Contact with Patient 06/06/21 2048   ?  (approximate) ? ? ?History  ? ?Emesis ? ? ?HPI ? ?Donald Carpenter is a 46 y.o. male  who, per discharge paperwork dated 03/22/21 has history of DM, who presents to the emergency department today because of concern for vomiting.  Patient states that he has been vomiting for roughly 2 weeks.  He will try to eat or drink something and feels like he gets stuck around his stomach.  He will then feel full and vomited up.  He thinks he has seen some blood in this.  Last bowel movement was 2 days ago.  Patient denies any fevers.  States he does have a history of pancreatitis but this feels different.  Patient has not checked his sugars in 2 months. ? ?Physical Exam  ? ?Triage Vital Signs: ?ED Triage Vitals  ?Enc Vitals Group  ?   BP 06/06/21 2028 (!) 162/131  ?   Pulse Rate 06/06/21 2028 (!) 120  ?   Resp 06/06/21 2028 (!) 22  ?   Temp --   ?   Temp src --   ?   SpO2 06/06/21 2028 99 %  ?   Weight 06/06/21 2027 185 lb (83.9 kg)  ?   Height 06/06/21 2027 6\' 5"  (1.956 m)  ?   Head Circumference --   ?   Peak Flow --   ?   Pain Score 06/06/21 2024 0  ? ?Most recent vital signs: ?Vitals:  ? 06/06/21 2033 06/06/21 2034  ?BP: (!) 164/94   ?Pulse:    ?Resp:  12  ?SpO2:    ? ? ?General: Awake, no distress.  ?CV:  Good peripheral perfusion.  ?Resp:  Normal effort.  ?Abd:  No distention. Non tender to palpation.  ? ? ?ED Results / Procedures / Treatments  ? ?Labs ?(all labs ordered are listed, but only abnormal results are displayed) ?Labs Reviewed  ?COMPREHENSIVE METABOLIC PANEL - Abnormal; Notable for the following components:  ?    Result Value  ? Glucose, Bld 216 (*)   ? BUN 26 (*)   ? Creatinine, Ser 1.34 (*)   ? Total Protein 8.3 (*)   ? All other components within normal limits  ?CBC WITH DIFFERENTIAL/PLATELET - Abnormal; Notable for the following components:  ? Hemoglobin 12.8 (*)   ? All other  components within normal limits  ?LIPASE, BLOOD  ? ? ? ?EKG ? ?[id number], attending physician, personally viewed and interpreted this EKG ? ?EKG Time: 2033 ?Rate: 89 ?Rhythm: sinus rhythm ?Axis: normal ?Intervals: qtc 468 ?QRS: narrow ?ST changes: no st elevation ?Impression: normal ekg ? ?RADIOLOGY ?None ? ?PROCEDURES: ? ?Critical Care performed: No ? ?Procedures ? ? ?MEDICATIONS ORDERED IN ED: ?Medications - No data to display ? ? ?IMPRESSION / MDM / ASSESSMENT AND PLAN / ED COURSE  ?I reviewed the triage vital signs and the nursing notes. ?             ?               ? ?Differential diagnosis includes, but is not limited to, gastroparesis, cholecystitis, gastroenteritis. ? ?Patient presents to the emergency department today because of concerns for nausea and vomiting for roughly 2 weeks.  Patient does have a history of diabetes.  On exam no significant abdominal tenderness.  Blood work without any concerning leukocytosis.  Mild elevation of the patient's creatinine which I think is likely secondary to dehydration.  Patient did feel improvement after IV fluids and IV Haldol.  He was able to tolerate p.o.  This time will plan on discharge but did discuss with patient recommendation to follow-up with GI. ? ? ?FINAL CLINICAL IMPRESSION(S) / ED DIAGNOSES  ? ?Final diagnoses:  ?Nausea and vomiting, unspecified vomiting type  ? ? ? ?Note:  This document was prepared using Dragon voice recognition software and may include unintentional dictation errors. ? ?  ?Phineas Semen, MD ?06/06/21 2344 ? ?

## 2021-06-06 NOTE — Discharge Instructions (Addendum)
Please seek medical attention for any high fevers, chest pain, shortness of breath, change in behavior, persistent vomiting, bloody stool or any other new or concerning symptoms.  

## 2021-06-06 NOTE — ED Notes (Signed)
Pt is resting with even respirations. Pt states he does not feel nauseous at this time.  ?

## 2021-06-07 NOTE — ED Notes (Signed)
Pt verbalized understanding of discharge instructions, prescriptions, and follow-up care instructions. Pt advised if symptoms worsen to return to ED.  

## 2022-10-27 ENCOUNTER — Other Ambulatory Visit: Payer: Self-pay

## 2022-10-27 ENCOUNTER — Encounter: Payer: Self-pay | Admitting: Emergency Medicine

## 2022-10-27 ENCOUNTER — Emergency Department: Payer: No Typology Code available for payment source

## 2022-10-27 ENCOUNTER — Emergency Department
Admission: EM | Admit: 2022-10-27 | Discharge: 2022-10-27 | Disposition: A | Discharge status: Home / Self Care | Payer: No Typology Code available for payment source | Attending: Emergency Medicine | Admitting: Emergency Medicine

## 2022-10-27 DIAGNOSIS — E11621 Type 2 diabetes mellitus with foot ulcer: Secondary | ICD-10-CM | POA: Diagnosis present

## 2022-10-27 DIAGNOSIS — I10 Essential (primary) hypertension: Secondary | ICD-10-CM | POA: Insufficient documentation

## 2022-10-27 DIAGNOSIS — L97419 Non-pressure chronic ulcer of right heel and midfoot with unspecified severity: Secondary | ICD-10-CM | POA: Diagnosis not present

## 2022-10-27 DIAGNOSIS — F17200 Nicotine dependence, unspecified, uncomplicated: Secondary | ICD-10-CM | POA: Diagnosis not present

## 2022-10-27 LAB — CBC WITH DIFFERENTIAL/PLATELET
Abs Immature Granulocytes: 0 10*3/uL (ref 0.00–0.07)
Basophils Absolute: 0 10*3/uL (ref 0.0–0.1)
Basophils Relative: 1 %
Eosinophils Absolute: 0.1 10*3/uL (ref 0.0–0.5)
Eosinophils Relative: 3 %
HCT: 36.7 % — ABNORMAL LOW (ref 39.0–52.0)
Hemoglobin: 11.7 g/dL — ABNORMAL LOW (ref 13.0–17.0)
Immature Granulocytes: 0 %
Lymphocytes Relative: 29 %
Lymphs Abs: 1.1 10*3/uL (ref 0.7–4.0)
MCH: 26.8 pg (ref 26.0–34.0)
MCHC: 31.9 g/dL (ref 30.0–36.0)
MCV: 84 fL (ref 80.0–100.0)
Monocytes Absolute: 0.2 10*3/uL (ref 0.1–1.0)
Monocytes Relative: 6 %
Neutro Abs: 2.2 10*3/uL (ref 1.7–7.7)
Neutrophils Relative %: 61 %
Platelets: 207 10*3/uL (ref 150–400)
RBC: 4.37 MIL/uL (ref 4.22–5.81)
RDW: 13 % (ref 11.5–15.5)
WBC: 3.6 10*3/uL — ABNORMAL LOW (ref 4.0–10.5)
nRBC: 0 % (ref 0.0–0.2)

## 2022-10-27 LAB — COMPREHENSIVE METABOLIC PANEL
ALT: 16 U/L (ref 0–44)
AST: 16 U/L (ref 15–41)
Albumin: 3.9 g/dL (ref 3.5–5.0)
Alkaline Phosphatase: 71 U/L (ref 38–126)
Anion gap: 10 (ref 5–15)
BUN: 29 mg/dL — ABNORMAL HIGH (ref 6–20)
CO2: 27 mmol/L (ref 22–32)
Calcium: 9.1 mg/dL (ref 8.9–10.3)
Chloride: 95 mmol/L — ABNORMAL LOW (ref 98–111)
Creatinine, Ser: 1.33 mg/dL — ABNORMAL HIGH (ref 0.61–1.24)
GFR, Estimated: >60 mL/min (ref >60)
Glucose, Bld: 499 mg/dL — ABNORMAL HIGH (ref 70–99)
Potassium: 4.7 mmol/L (ref 3.5–5.1)
Sodium: 132 mmol/L — ABNORMAL LOW (ref 135–145)
Total Bilirubin: 0.5 mg/dL (ref 0.3–1.2)
Total Protein: 7.7 g/dL (ref 6.5–8.1)

## 2022-10-27 LAB — LACTIC ACID, PLASMA: Lactic Acid, Venous: 1.7 mmol/L (ref 0.5–1.9)

## 2022-10-27 MED ORDER — AMOXICILLIN-POT CLAVULANATE 875-125 MG PO TABS: 1.0000 | ORAL_TABLET | Freq: Two times a day (BID) | ORAL | 0 refills | Status: AC

## 2022-10-27 MED ORDER — VANCOMYCIN HCL IN DEXTROSE 1-5 GM/200ML-% IV SOLN
1000.0000 mg | Freq: Once | INTRAVENOUS | Status: AC
  Administered 2022-10-27: 1000 mg via INTRAVENOUS
  Filled 2022-10-27: qty 200

## 2022-10-27 MED ORDER — PIPERACILLIN-TAZOBACTAM 3.375 G IVPB
3.3750 g | Freq: Once | INTRAVENOUS | Status: AC
  Administered 2022-10-27: 3.375 g via INTRAVENOUS
  Filled 2022-10-27: qty 50

## 2022-10-27 MED ORDER — CIPROFLOXACIN HCL 500 MG PO TABS: 500.0000 mg | ORAL_TABLET | Freq: Two times a day (BID) | ORAL | 0 refills | Status: AC

## 2022-10-27 NOTE — Discharge Instructions (Addendum)
Your wound is consistent with a diabetic foot ulcer and is concerning for a bone infection.  Please take the antibiotics as prescribed.  It is very important that you take all of the medication.  Please follow-up with your podiatrist.  I also recommend that you follow-up with your primary care provider for additional management of your diabetes.  You can return to the ED at anytime for new or worsening symptoms.

## 2022-10-27 NOTE — ED Triage Notes (Signed)
Pt comes with c/o right foot pain. Pt states he has callus on right foot and it is painful.

## 2022-10-27 NOTE — ED Notes (Signed)
See triage note  Presents with an open ulcer to posterior right foot  States he has had this in the past  No drainage noted at present

## 2022-10-27 NOTE — ED Provider Notes (Signed)
[facility] Provider Note    Event Date/Time   First MD Initiated Contact with Patient 10/27/22 1032     (approximate)   History   Foot Pain   HPI  Donald Carpenter is a 47 y.o. male with PMH of diabetes, diabetic foot infection, hypertension and tobacco use disorder who presents for evaluation of right foot pain.  Patient states that his wound has opened up and has become more painful.  He endorses some drainage.  He has been able to walk and go to work.     Physical Exam   Triage Vital Signs: ED Triage Vitals  Encounter Vitals Group     BP 10/27/22 0920 (!) 140/86     Systolic BP Percentile --      Diastolic BP Percentile --      Pulse Rate 10/27/22 0920 86     Resp 10/27/22 0920 18     Temp 10/27/22 0920 98 F (36.7 C)     Temp src --      SpO2 10/27/22 0920 96 %     Weight 10/27/22 1002 184 lb 15.5 oz (83.9 kg)     Height 10/27/22 1002 6\' 5"  (1.956 m)     Head Circumference --      Peak Flow --      Pain Score 10/27/22 0919 4     Pain Loc --      Pain Education --      Exclude from Growth Chart --     Most recent vital signs: Vitals:   10/27/22 0920 10/27/22 1322  BP: (!) 140/86 138/80  Pulse: 86 80  Resp: 18 18  Temp: 98 F (36.7 C) 98.2 F (36.8 C)  SpO2: 96% 96%    General: Awake, no distress.  CV:  Good peripheral perfusion.  Resp:  Normal effort.  Abd:  No distention.  Other:  Diabetic foot ulcer approximately 3 cm long 1.5 cm wide and 1 cm deep, multiple layers of tissue exposed.     ED Results / Procedures / Treatments   Labs (all labs ordered are listed, but only abnormal results are displayed) Labs Reviewed  CBC WITH DIFFERENTIAL/PLATELET - Abnormal; Notable for the following components:      Result Value   WBC 3.6 (*)    Hemoglobin 11.7 (*)    HCT 36.7 (*)    All other components within normal limits  COMPREHENSIVE METABOLIC PANEL - Abnormal; Notable for the following components:   Sodium 132 (*)     Chloride 95 (*)    Glucose, Bld 499 (*)    BUN 29 (*)    Creatinine, Ser 1.33 (*)    All other components within normal limits  CULTURE, BLOOD (ROUTINE X 2)  CULTURE, BLOOD (ROUTINE X 2)  AEROBIC/ANAEROBIC CULTURE W GRAM STAIN (SURGICAL/DEEP WOUND)  LACTIC ACID, PLASMA  LACTIC ACID, PLASMA     RADIOLOGY   Right foot x-rays obtained, I interpreted the images as well as reviewed the radiologist report.   PROCEDURES:  Critical Care performed: No  Procedures   MEDICATIONS ORDERED IN ED: Medications  piperacillin-tazobactam (ZOSYN) IVPB 3.375 g (3.375 g Intravenous New Bag/Given 10/27/22 1300)  vancomycin (VANCOCIN) IVPB 1000 mg/200 mL premix (0 mg Intravenous Stopped 10/27/22 1401)     IMPRESSION / MDM / ASSESSMENT AND PLAN / ED COURSE  I reviewed the triage vital signs and the nursing notes.  47 year old male presents for evaluation of right foot pain.  Patient was hypertensive in triage otherwise VSS.  Patient NAD on exam.  Differential diagnosis includes, but is not limited to, diabetic foot ulcer, osteomyelitis, sepsis.  Patient's presentation is most consistent with acute complicated illness / injury requiring diagnostic workup.  CMP notable for mild hyponatremia, hypokalemia, elevated creatinine and hyperglycemia.  CBC unremarkable.  Lactic WNL  Right foot x-rays obtained to evaluate for osteomyelitis, I interpreted the images as well as reviewed the radiologist report which was negative.  However given the presentation of the wound, I still suspect osteomyelitis.  I recommended to the patient that he be admitted for IV antibiotics and an MRI to further evaluate the extent of his wound.  Patient stated that he needed to go to work and did not have anyone to cover him.  I advised him that he would be leaving AMA and we reviewed the risks of leaving including worsening infection and possible foot amputation as a result.  Patient voiced  understanding.  He was given Zosyn and vancomycin while here.  At the time of discharge still awaiting results of the blood cultures and wound culture.  I recommended that patient follow-up with podiatry.  He will be given ciprofloxacin and Augmentin to take outpatient.  I also encouraged him to follow-up with his primary care as his blood sugar was elevated today and he states he is taking his medications appropriately.  Patient voiced understanding, all questions were answered and he was stable at discharge.      FINAL CLINICAL IMPRESSION(S) / ED DIAGNOSES   Final diagnoses:  Diabetic ulcer of right midfoot associated with type 2 diabetes mellitus, unspecified ulcer stage (HCC)     Rx / DC Orders   ED Discharge Orders          Ordered    amoxicillin-clavulanate (AUGMENTIN) 875-125 MG tablet  2 times daily        10/27/22 1329    ciprofloxacin (CIPRO) 500 MG tablet  2 times daily        10/27/22 1329             Note:  This document was prepared using Dragon voice recognition software and may include unintentional dictation errors.   Cameron Ali, PA-C 10/27/22 1419    Chesley Noon, MD 10/27/22 [phone]

## 2022-11-01 LAB — CULTURE, BLOOD (ROUTINE X 2)
Culture: NO GROWTH
Culture: NO GROWTH
Special Requests: ADEQUATE

## 2022-11-02 LAB — AEROBIC/ANAEROBIC CULTURE W GRAM STAIN (SURGICAL/DEEP WOUND): Gram Stain: NONE SEEN

## 2023-05-02 ENCOUNTER — Encounter: Payer: Self-pay | Admitting: Gastroenterology

## 2023-05-02 DIAGNOSIS — K869 Disease of pancreas, unspecified: Secondary | ICD-10-CM

## 2023-05-03 ENCOUNTER — Other Ambulatory Visit: Payer: Self-pay | Admitting: Gastroenterology

## 2023-05-03 DIAGNOSIS — K869 Disease of pancreas, unspecified: Secondary | ICD-10-CM

## 2023-05-17 ENCOUNTER — Ambulatory Visit
Admission: RE | Admit: 2023-05-17 | Discharge: 2023-05-17 | Disposition: A | Discharge status: Home / Self Care | Source: Ambulatory Visit | Attending: Gastroenterology | Admitting: Gastroenterology

## 2023-05-17 DIAGNOSIS — K869 Disease of pancreas, unspecified: Secondary | ICD-10-CM | POA: Insufficient documentation

## 2023-05-17 LAB — POCT I-STAT CREATININE: Creatinine, Ser: 1.3 mg/dL — ABNORMAL HIGH (ref 0.61–1.24)

## 2023-05-17 MED ORDER — IOHEXOL 300 MG/ML  SOLN
100.0000 mL | Freq: Once | INTRAMUSCULAR | Status: AC | PRN
  Administered 2023-05-17: 100 mL via INTRAVENOUS
